# Patient Record
Sex: Female | Born: 1990
Health system: Southern US, Community
[De-identification: ages and names within clinical notes are randomized; demographics above are authoritative.]

## PROBLEM LIST (undated history)

## (undated) HISTORY — PX: CATARACT PEDIATRIC: SHX6289

---

## 2010-02-16 ENCOUNTER — Emergency Department (HOSPITAL_COMMUNITY)
Admission: EM | Admit: 2010-02-16 | Discharge: 2010-02-17 | Payer: Self-pay | Source: Home / Self Care | Admitting: Emergency Medicine

## 2010-02-22 LAB — CBC
HCT: 40.7 % (ref 36.0–46.0)
Hemoglobin: 14.3 g/dL (ref 12.0–15.0)
MCH: 32.9 pg (ref 26.0–34.0)
MCHC: 35.1 g/dL (ref 30.0–36.0)
MCV: 93.8 fL (ref 78.0–100.0)
Platelets: 177 10*3/uL (ref 150–400)
RBC: 4.34 MIL/uL (ref 3.87–5.11)
RDW: 12.2 % (ref 11.5–15.5)
WBC: 7.3 10*3/uL (ref 4.0–10.5)

## 2010-02-22 LAB — WET PREP, GENITAL
Trich, Wet Prep: NONE SEEN
Yeast Wet Prep HPF POC: NONE SEEN

## 2010-02-22 LAB — POCT I-STAT, CHEM 8
BUN: 10 mg/dL (ref 6–23)
Calcium, Ion: 1.13 mmol/L (ref 1.12–1.32)
Chloride: 102 mEq/L (ref 96–112)
Creatinine, Ser: 1 mg/dL (ref 0.4–1.2)
Glucose, Bld: 90 mg/dL (ref 70–99)
HCT: 44 % (ref 36.0–46.0)
Hemoglobin: 15 g/dL (ref 12.0–15.0)
Potassium: 3.1 mEq/L — ABNORMAL LOW (ref 3.5–5.1)
Sodium: 137 mEq/L (ref 135–145)
TCO2: 24 mmol/L (ref 0–100)

## 2010-02-22 LAB — DIFFERENTIAL
Basophils Absolute: 0 10*3/uL (ref 0.0–0.1)
Basophils Relative: 0 % (ref 0–1)
Eosinophils Absolute: 0.1 10*3/uL (ref 0.0–0.7)
Eosinophils Relative: 1 % (ref 0–5)
Lymphocytes Relative: 9 % — ABNORMAL LOW (ref 12–46)
Lymphs Abs: 0.6 10*3/uL — ABNORMAL LOW (ref 0.7–4.0)
Monocytes Absolute: 0.9 10*3/uL (ref 0.1–1.0)
Monocytes Relative: 12 % (ref 3–12)
Neutro Abs: 5.8 10*3/uL (ref 1.7–7.7)
Neutrophils Relative %: 79 % — ABNORMAL HIGH (ref 43–77)

## 2010-02-22 LAB — URINALYSIS, ROUTINE W REFLEX MICROSCOPIC
Bilirubin Urine: NEGATIVE
Hgb urine dipstick: NEGATIVE
Nitrite: NEGATIVE
Protein, ur: NEGATIVE mg/dL
Specific Gravity, Urine: 1.037 — ABNORMAL HIGH (ref 1.005–1.030)
Urine Glucose, Fasting: NEGATIVE mg/dL
Urobilinogen, UA: 1 mg/dL (ref 0.0–1.0)
pH: 7 (ref 5.0–8.0)

## 2010-02-22 LAB — PREGNANCY, URINE: Preg Test, Ur: NEGATIVE

## 2010-02-22 LAB — GC/CHLAMYDIA PROBE AMP, GENITAL
Chlamydia, DNA Probe: NEGATIVE
GC Probe Amp, Genital: NEGATIVE

## 2011-03-03 ENCOUNTER — Encounter (HOSPITAL_COMMUNITY): Payer: Self-pay

## 2011-03-03 ENCOUNTER — Emergency Department (INDEPENDENT_AMBULATORY_CARE_PROVIDER_SITE_OTHER): Payer: BC Managed Care – PPO

## 2011-03-03 ENCOUNTER — Emergency Department (HOSPITAL_COMMUNITY)
Admission: EM | Admit: 2011-03-03 | Discharge: 2011-03-03 | Disposition: A | Discharge status: Home / Self Care | Payer: BC Managed Care – PPO | Source: Home / Self Care | Attending: Family Medicine | Admitting: Family Medicine

## 2011-03-03 DIAGNOSIS — M940 Chondrocostal junction syndrome [Tietze]: Secondary | ICD-10-CM

## 2011-03-03 MED ORDER — NAPROXEN 375 MG PO TABS: 375.0000 mg | ORAL_TABLET | Freq: Two times a day (BID) | ORAL | Status: AC

## 2011-03-03 NOTE — ED Notes (Signed)
C/o pain in rt lower, ante. Ribs with movement, palpation and breathing since yesterday.  Denies recent cold/cough or injury.

## 2011-03-03 NOTE — ED Provider Notes (Signed)
History     CSN: [id number]  Arrival date & time 03/03/11  1003   First MD Initiated Contact with Patient 03/03/11 1035      Chief Complaint  Patient presents with  . Chest Pain    (Consider location/radiation/quality/duration/timing/severity/associated sxs/prior treatment) Patient is a 21 y.o. female presenting with chest pain. The history is provided by the patient.  Chest Pain The chest pain began yesterday. Chest pain occurs constantly. The chest pain is unchanged. The pain is associated with breathing, coughing, stress and exertion. The severity of the pain is moderate. The quality of the pain is described as sharp and pleuritic. The pain does not radiate. Chest pain is worsened by exertion and deep breathing. Pertinent negatives for primary symptoms include no fever, no fatigue, no cough and no wheezing. She tried nothing for the symptoms. Risk factors include no known risk factors.     History reviewed. No pertinent past medical history.  History reviewed. No pertinent past surgical history.  No family history on file.  History  Substance Use Topics  . Smoking status: Never Smoker   . Smokeless tobacco: Not on file  . Alcohol Use: No    OB History    Grav Para Term Preterm Abortions TAB SAB Ect Mult Living                  Review of Systems  Constitutional: Negative.  Negative for fever and fatigue.  HENT: Negative.   Eyes: Negative.   Respiratory: Negative.  Negative for cough and wheezing.   Cardiovascular: Positive for chest pain.  Gastrointestinal: Negative.   Genitourinary: Negative.   Musculoskeletal: Negative.   Skin: Negative.   Neurological: Negative.     Allergies  Review of patient's allergies indicates no known allergies.  Home Medications   Current Outpatient Rx  Name Route Sig Dispense Refill  . NAPROXEN 375 MG PO TABS Oral Take 1 tablet (375 mg total) by mouth 2 (two) times daily. 20 tablet 0    BP 122/73  Pulse 83  Temp(Src) 98.5  F (36.9 C) (Oral)  Resp 16  SpO2 99%  LMP 02/04/2011  Physical Exam  Nursing note and vitals reviewed. Constitutional: She is oriented to person, place, and time. She appears well-developed and well-nourished.  HENT:  Head: Normocephalic and atraumatic.  Eyes: EOM are normal.  Neck: Normal range of motion.  Pulmonary/Chest: Effort normal and breath sounds normal. She has no wheezes. She has no rhonchi. She exhibits tenderness.    Musculoskeletal: Normal range of motion.  Neurological: She is alert and oriented to person, place, and time.  Skin: Skin is warm and dry.  Psychiatric: Her behavior is normal.    ED Course  Procedures (including critical care time)  Labs Reviewed - No data to display No results found.   1. Costochondritis       MDM  Chest wall tenderness; rx given for naproxen        Richardo Priest, MD 03/27/11 [phone]

## 2012-10-24 ENCOUNTER — Emergency Department (HOSPITAL_COMMUNITY)
Admission: EM | Admit: 2012-10-24 | Discharge: 2012-10-24 | Disposition: A | Discharge status: Home / Self Care | Payer: BC Managed Care – PPO | Source: Home / Self Care | Attending: Family Medicine | Admitting: Family Medicine

## 2012-10-24 ENCOUNTER — Encounter (HOSPITAL_COMMUNITY): Payer: Self-pay

## 2012-10-24 DIAGNOSIS — R51 Headache: Secondary | ICD-10-CM

## 2012-10-24 MED ORDER — KETOROLAC TROMETHAMINE 30 MG/ML IJ SOLN
30.0000 mg | Freq: Once | INTRAMUSCULAR | Status: AC
  Administered 2012-10-24: 30 mg via INTRAMUSCULAR

## 2012-10-24 MED ORDER — KETOROLAC TROMETHAMINE 30 MG/ML IJ SOLN
INTRAMUSCULAR | Status: AC
  Filled 2012-10-24: qty 1

## 2012-10-24 MED ORDER — DEXAMETHASONE SODIUM PHOSPHATE 10 MG/ML IJ SOLN
10.0000 mg | Freq: Once | INTRAMUSCULAR | Status: AC
  Administered 2012-10-24: 10 mg via INTRAMUSCULAR

## 2012-10-24 MED ORDER — DEXAMETHASONE SODIUM PHOSPHATE 10 MG/ML IJ SOLN
INTRAMUSCULAR | Status: AC
  Filled 2012-10-24: qty 1

## 2012-10-24 NOTE — ED Provider Notes (Signed)
Medical screening examination/treatment/procedure(s) were performed by non-physician practitioner and as supervising physician I was immediately available for consultation/collaboration.  Kyrus Hyde, M.D.  Prentiss Polio C Allecia Bells, MD 10/24/12 1722 

## 2012-10-24 NOTE — ED Notes (Signed)
C/o headache since yesterday w n/v/d; no one else in home ill; states no relief w OTC medication; hist of cataract surgery

## 2012-10-24 NOTE — ED Provider Notes (Signed)
CSN: [id number]     Arrival date & time 10/24/12  1258 History   First MD Initiated Contact with Patient 10/24/12 1330     Chief Complaint  Patient presents with  . Headache   (Consider location/radiation/quality/duration/timing/severity/associated sxs/prior Treatment) HPI Comments: 22 year old female presents with a headache for 24 hours. Yesterday the headache began and was at its worse associated with dizziness, vomiting and diarrhea. Overnight the GI symptoms have abated and her headache is improved somewhat but she continues to have headache over both of her is across reported. She has a decreased appetite and is taking Excedrin headache for her symptoms. Denies problems with vision, speech, hearing, swallowing or other focal neurologic complaint. She has no PCP. She has a history of headaches in her childhood but caused by cataracts per patient. She has since had surgery for this.  Patient is a 22 y.o. female presenting with headaches.  Headache Associated symptoms: no congestion, no dizziness, no ear pain, no pain, no fatigue, no fever, no neck pain, no seizures, no sinus pressure and no sore throat     History reviewed. No pertinent past medical history. History reviewed. No pertinent past surgical history. History reviewed. No pertinent family history. History  Substance Use Topics  . Smoking status: Never Smoker   . Smokeless tobacco: Not on file  . Alcohol Use: No   OB History   Grav Para Term Preterm Abortions TAB SAB Ect Mult Living                 Review of Systems  Constitutional: Positive for appetite change. Negative for fever, activity change and fatigue.  HENT: Negative for ear pain, congestion, sore throat, facial swelling, rhinorrhea, neck pain and sinus pressure.   Eyes: Negative for pain, discharge, redness and visual disturbance.  Respiratory: Negative.   Cardiovascular: Negative.   Gastrointestinal:       No symptoms today.  Genitourinary: Negative.    Musculoskeletal: Negative.   Skin: Negative.   Neurological: Positive for headaches. Negative for dizziness, tremors, seizures, syncope, speech difficulty and weakness.  Psychiatric/Behavioral: Negative.     Allergies  Review of patient's allergies indicates no known allergies.  Home Medications  No current outpatient prescriptions on file. BP 120/77  Pulse 80  Temp(Src) 98.9 F (37.2 C) (Oral)  Resp 16  SpO2 100% Physical Exam  Nursing note and vitals reviewed. Constitutional: She is oriented to person, place, and time. She appears well-developed and well-nourished. No distress.  HENT:  Head: Normocephalic and atraumatic.  Mouth/Throat: Oropharynx is clear and moist. No oropharyngeal exudate.  Eyes: Conjunctivae and EOM are normal. Pupils are equal, round, and reactive to light.  Neck: Normal range of motion. Neck supple.  Cardiovascular: Normal rate, regular rhythm and normal heart sounds.   Pulmonary/Chest: Effort normal and breath sounds normal. No respiratory distress.  Abdominal: Soft. She exhibits no distension. There is no tenderness.  Musculoskeletal: She exhibits no edema.  Lymphadenopathy:    She has no cervical adenopathy.  Neurological: She is alert and oriented to person, place, and time. She has normal strength. She displays no tremor. No cranial nerve deficit or sensory deficit. She exhibits normal muscle tone. Coordination and gait normal.  Skin: Skin is warm and dry. No rash noted.  Psychiatric: She has a normal mood and affect.    ED Course  Procedures (including critical care time) Labs Review Labs Reviewed - No data to display Imaging Review No results found.  MDM   1. Headache above  the eye region       Differential includes H/A associated with cataracts, viral syndrome or tension. No neurologic sx's or abnormal findings.  Toradol 30mg  IM  Decadron 10mg  IM. Tylenol q 4h prn.  Hayden Rasmussen, NP 10/24/12 1426

## 2012-10-25 ENCOUNTER — Encounter (HOSPITAL_COMMUNITY): Payer: Self-pay

## 2012-10-25 ENCOUNTER — Emergency Department (HOSPITAL_COMMUNITY)
Admission: EM | Admit: 2012-10-25 | Discharge: 2012-10-25 | Disposition: A | Discharge status: Home / Self Care | Payer: BC Managed Care – PPO | Attending: Emergency Medicine | Admitting: Emergency Medicine

## 2012-10-25 DIAGNOSIS — Z3202 Encounter for pregnancy test, result negative: Secondary | ICD-10-CM | POA: Insufficient documentation

## 2012-10-25 DIAGNOSIS — Z79899 Other long term (current) drug therapy: Secondary | ICD-10-CM | POA: Insufficient documentation

## 2012-10-25 DIAGNOSIS — G43909 Migraine, unspecified, not intractable, without status migrainosus: Secondary | ICD-10-CM | POA: Insufficient documentation

## 2012-10-25 DIAGNOSIS — R110 Nausea: Secondary | ICD-10-CM

## 2012-10-25 DIAGNOSIS — R197 Diarrhea, unspecified: Secondary | ICD-10-CM | POA: Insufficient documentation

## 2012-10-25 LAB — POCT PREGNANCY, URINE: Preg Test, Ur: NEGATIVE

## 2012-10-25 MED ORDER — KETOROLAC TROMETHAMINE 15 MG/ML IJ SOLN
15.0000 mg | Freq: Once | INTRAMUSCULAR | Status: AC
  Administered 2012-10-25: 15 mg via INTRAVENOUS
  Filled 2012-10-25: qty 1

## 2012-10-25 MED ORDER — SODIUM CHLORIDE 0.9 % IV BOLUS (SEPSIS)
500.0000 mL | Freq: Once | INTRAVENOUS | Status: AC
  Administered 2012-10-25: 500 mL via INTRAVENOUS

## 2012-10-25 MED ORDER — METOCLOPRAMIDE HCL 10 MG PO TABS: 5.0000 mg | ORAL_TABLET | Freq: Four times a day (QID) | ORAL | Status: AC | PRN

## 2012-10-25 MED ORDER — DIPHENHYDRAMINE HCL 50 MG/ML IJ SOLN
25.0000 mg | Freq: Once | INTRAMUSCULAR | Status: AC
  Administered 2012-10-25: 25 mg via INTRAVENOUS
  Filled 2012-10-25: qty 1

## 2012-10-25 MED ORDER — METOCLOPRAMIDE HCL 5 MG/ML IJ SOLN
10.0000 mg | Freq: Once | INTRAMUSCULAR | Status: AC
  Administered 2012-10-25: 10 mg via INTRAVENOUS
  Filled 2012-10-25: qty 2

## 2012-10-25 NOTE — ED Provider Notes (Signed)
CSN: [id number]     Arrival date & time 10/25/12  0802 History   First MD Initiated Contact with Patient 10/25/12 0809     Chief Complaint  Patient presents with  . Migraine   (Consider location/radiation/quality/duration/timing/severity/associated sxs/prior Treatment) HPI Comments: 22 yo female with cataract hx right eye presents with frontal HA for three days, started off mild along with a GI illness then gradually worsened, photophobia, mild nausea.  Hx of ha but not recently.  Similar to previous in the past.  No blood clot hx.  Pt is on birth control but it has been years.  No injuries or fevers.  No travel or tic bites. Pt had an MRI brain years ago.  Patient is a 22 y.o. female presenting with migraines. The history is provided by the patient.  Migraine This is a recurrent problem. Associated symptoms include headaches. Pertinent negatives include no chest pain, no abdominal pain and no shortness of breath.    History reviewed. No pertinent past medical history. Past Surgical History  Procedure Laterality Date  . Cataract pediatric     History reviewed. No pertinent family history. History  Substance Use Topics  . Smoking status: Never Smoker   . Smokeless tobacco: Not on file  . Alcohol Use: No   OB History   Grav Para Term Preterm Abortions TAB SAB Ect Mult Living                 Review of Systems  Constitutional: Negative for fever and chills.  HENT: Negative for neck pain and neck stiffness.   Eyes: Negative for visual disturbance.  Respiratory: Negative for shortness of breath.   Cardiovascular: Negative for chest pain.  Gastrointestinal: Positive for nausea and diarrhea. Negative for vomiting and abdominal pain.  Genitourinary: Negative for dysuria and flank pain.  Musculoskeletal: Negative for back pain.  Skin: Negative for rash.  Neurological: Positive for headaches. Negative for light-headedness.    Allergies  Review of patient's allergies indicates no  known allergies.  Home Medications   Current Outpatient Rx  Name  Route  Sig  Dispense  Refill  . aspirin-acetaminophen-caffeine (EXCEDRIN MIGRAINE) 250-250-65 MG per tablet   Oral   Take 2 tablets by mouth every 6 (six) hours as needed for pain.         Marland Kitchen GENERESS FE 0.8-25 MG-MCG tablet   Oral   Chew 1 tablet by mouth daily.          LMP 10/04/2012 Physical Exam  Nursing note and vitals reviewed. Constitutional: She is oriented to person, place, and time. She appears well-developed and well-nourished.  HENT:  Head: Normocephalic and atraumatic.  Eyes: Conjunctivae are normal. Right eye exhibits no discharge. Left eye exhibits no discharge.  Neck: Normal range of motion. Neck supple. No tracheal deviation present.  Cardiovascular: Normal rate and regular rhythm.   Pulmonary/Chest: Effort normal and breath sounds normal.  Abdominal: Soft. She exhibits no distension. There is no tenderness. There is no guarding.  Musculoskeletal: She exhibits no edema.  Neurological: She is alert and oriented to person, place, and time. No cranial nerve deficit or sensory deficit. GCS eye subscore is 4. GCS verbal subscore is 5. GCS motor subscore is 6.  5+ strength in UE and LE with f/e at major joints. Sensation to palpation intact in UE and LE. CNs 2-12 grossly intact.  EOMFI.  PERRL.   Finger nose and coordination intact bilateral.   Visual fields intact to finger testing.   Skin:  Skin is warm. No rash noted.  Psychiatric: She has a normal mood and affect.    ED Course  Procedures (including critical care time) Labs Review Labs Reviewed - No data to display Imaging Review No results found.  MDM  No diagnosis found. Low risk HA. Migraine cocktail and fluids in ED.  Pt improved significantly on recheck. Normal neuro exam. HPI and exam consistent with SAH, pt very low risk  And pretest prob too low at this time for venous thrombosis and with HA starting after GI infection likely  component of dehydration vs migraine.   Close fup outpt discussed. No indication for imaging at this time, discussed she may need MRI if HA returns. Reasons to return given.     Enid Skeens, MD 10/25/12 1005

## 2012-10-25 NOTE — ED Notes (Signed)
Pt c/o migraine x 3 days.  Pain score 8/10. Sts recently had a virus.  Sts other symptoms resolved, but headache remained.

## 2012-10-25 NOTE — ED Notes (Signed)
Pt escorted to discharge window. Verbalized understanding discharge instructions. In no acute distress. Vitals reviewed and WDL.  

## 2012-10-25 NOTE — Progress Notes (Signed)
[facility] ED CM noted pt with coverage but no pcp listed Spoke with pt who confirms no pcp  [facility] ED CM spoke with pt on how to obtain an in network pcp with insurance coverage via the customer service number or web site  Cm reviewed ED level of care for crisis/emergent services and community pcp level of care to manage continuous or chronic medical concerns.  The pt voiced understanding CM encouraged pt and discussed pt's responsibility to verify with pt's insurance carrier that any recommended medical provider offered by any emergency room or a hospital provider is within the carrier's network. The pt voiced understanding      

## 2012-10-30 IMAGING — CR DG RIBS W/ CHEST 3+V*R*
3 series · 3 of 3 positions shown · non-contrast
Comparison: None.

CLINICAL DATA: Right chest pain.  Right upper quadrant pain.

RIGHT RIBS AND CHEST - 3+ VIEW

[view not recorded (1 of 3)]
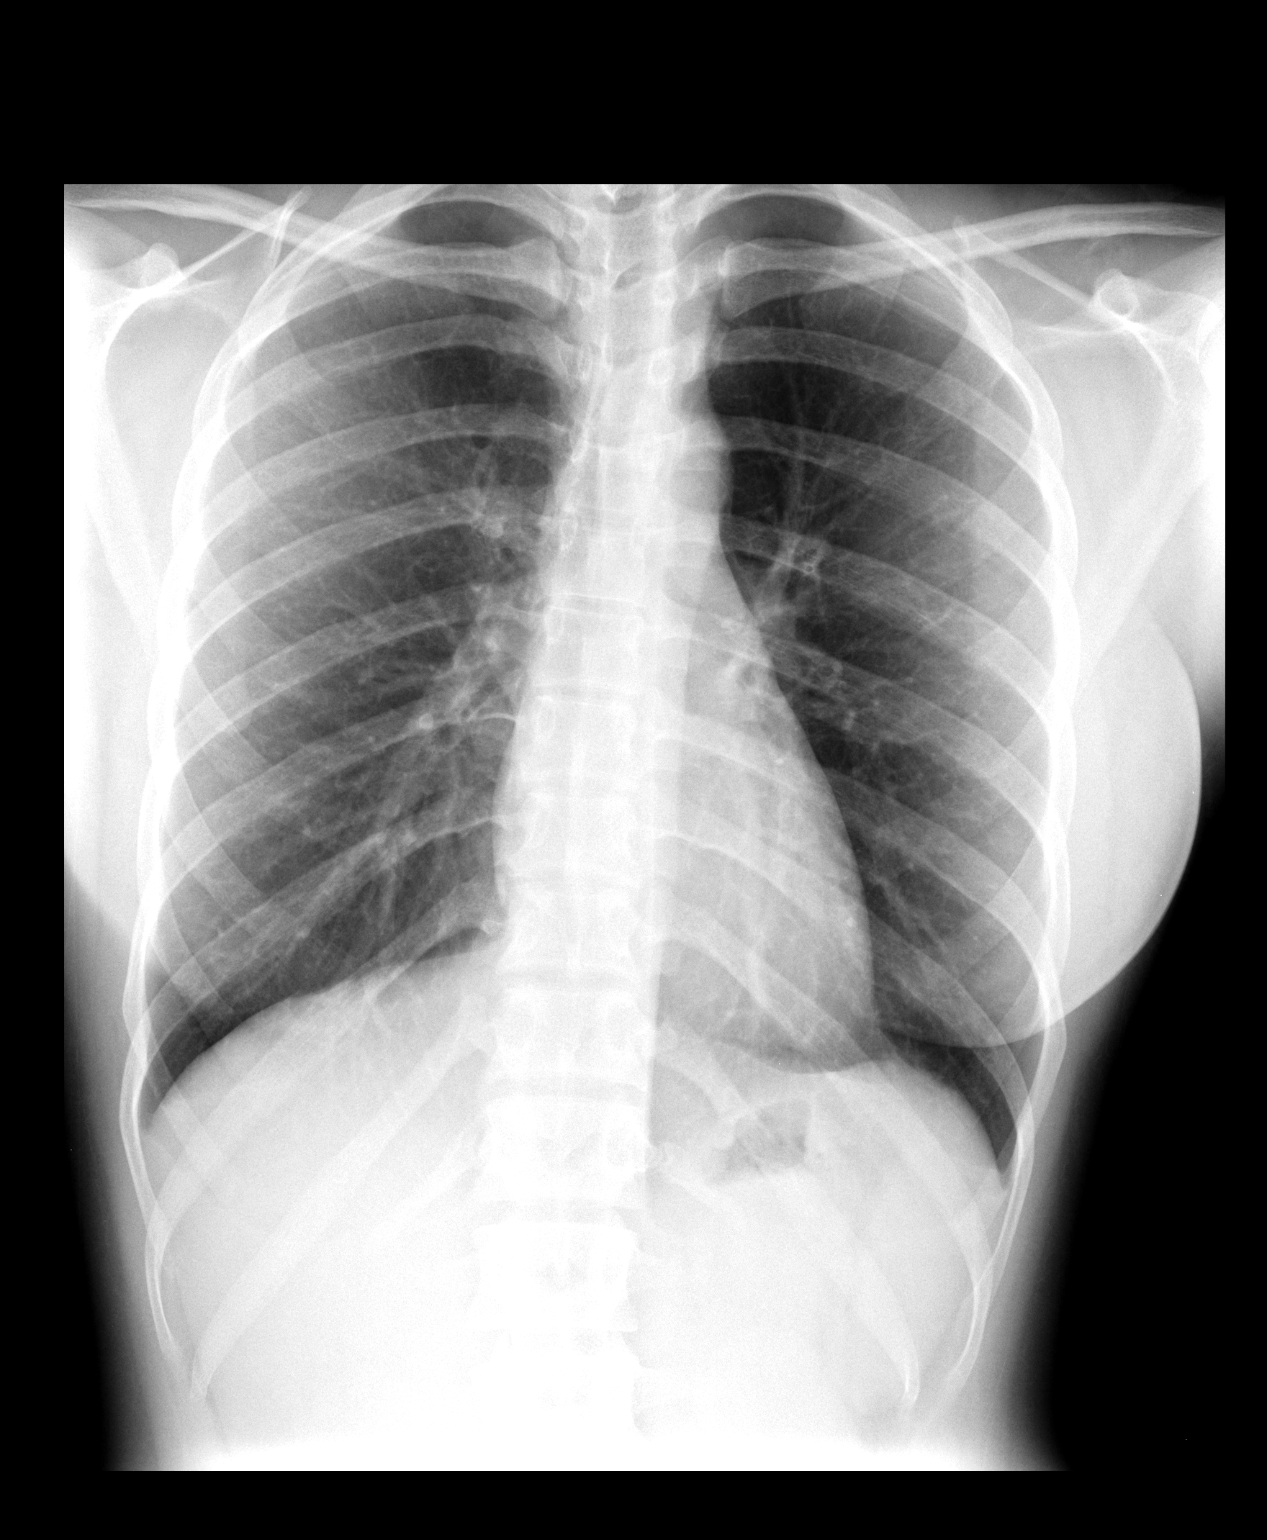

[view not recorded (2 of 3)]
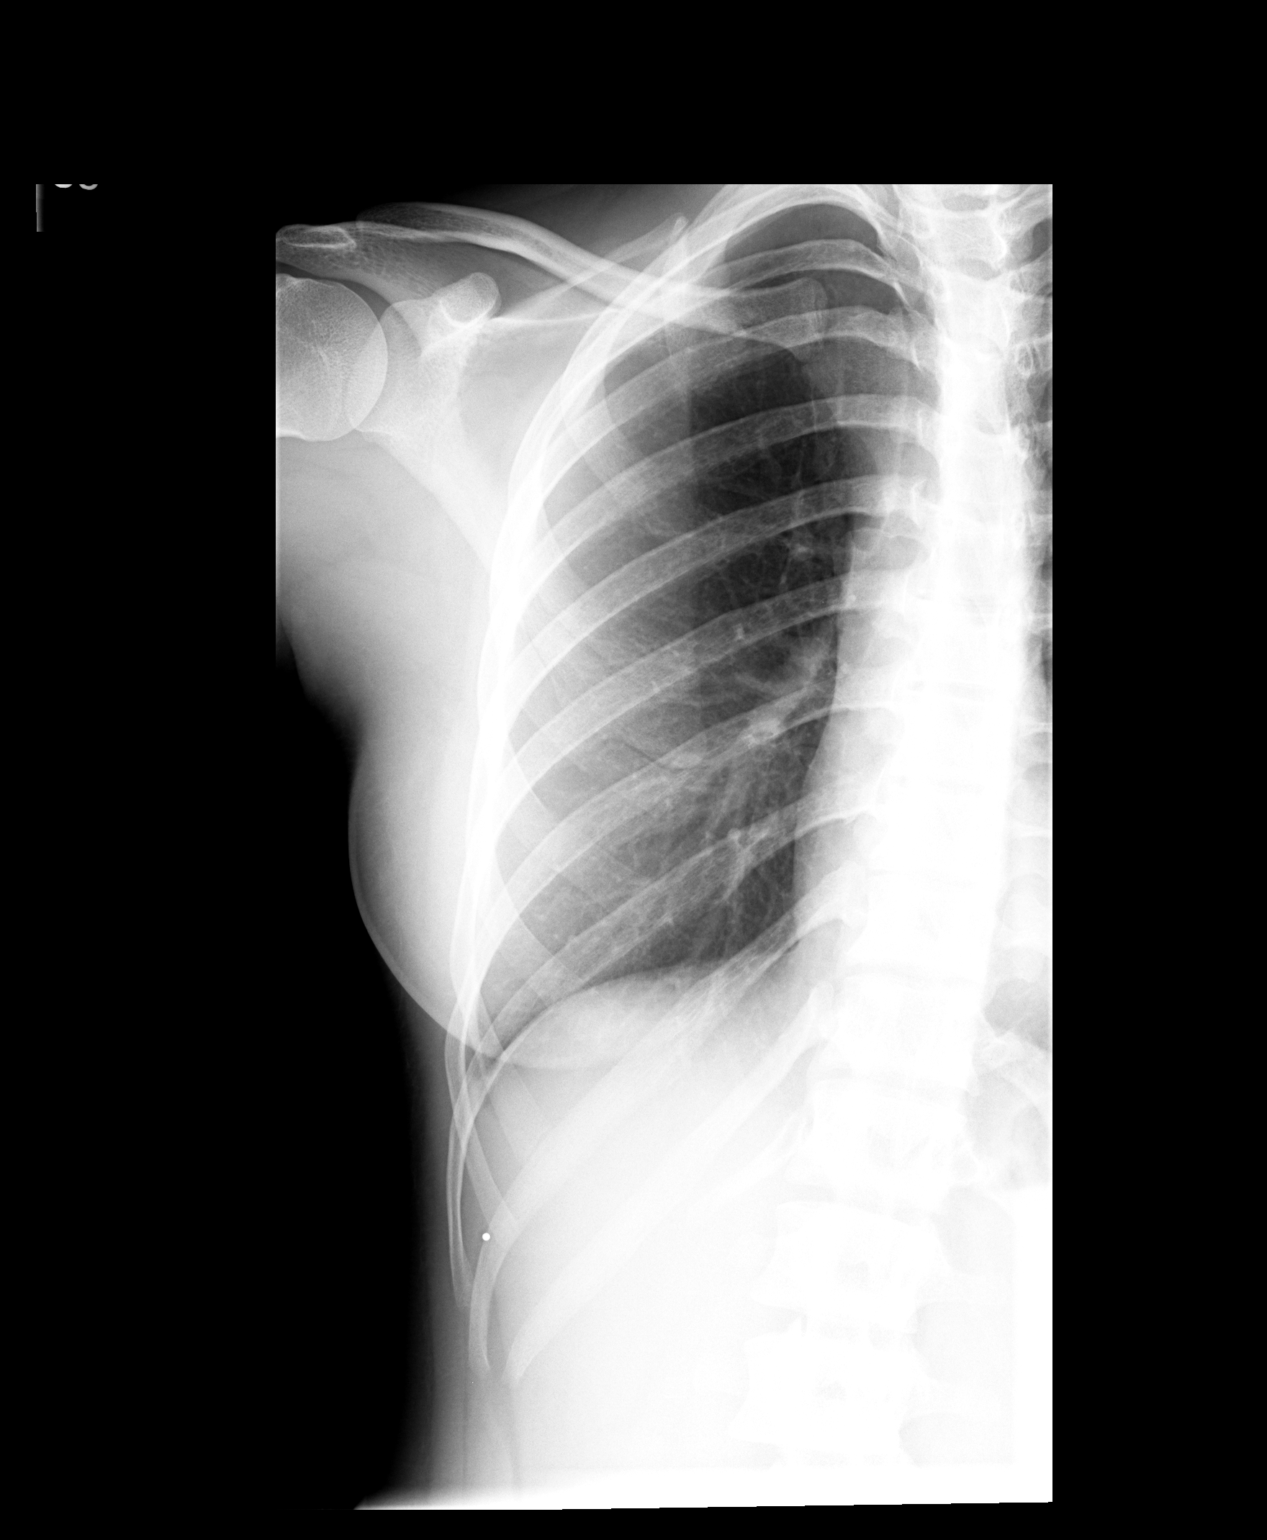

[view not recorded (3 of 3)]
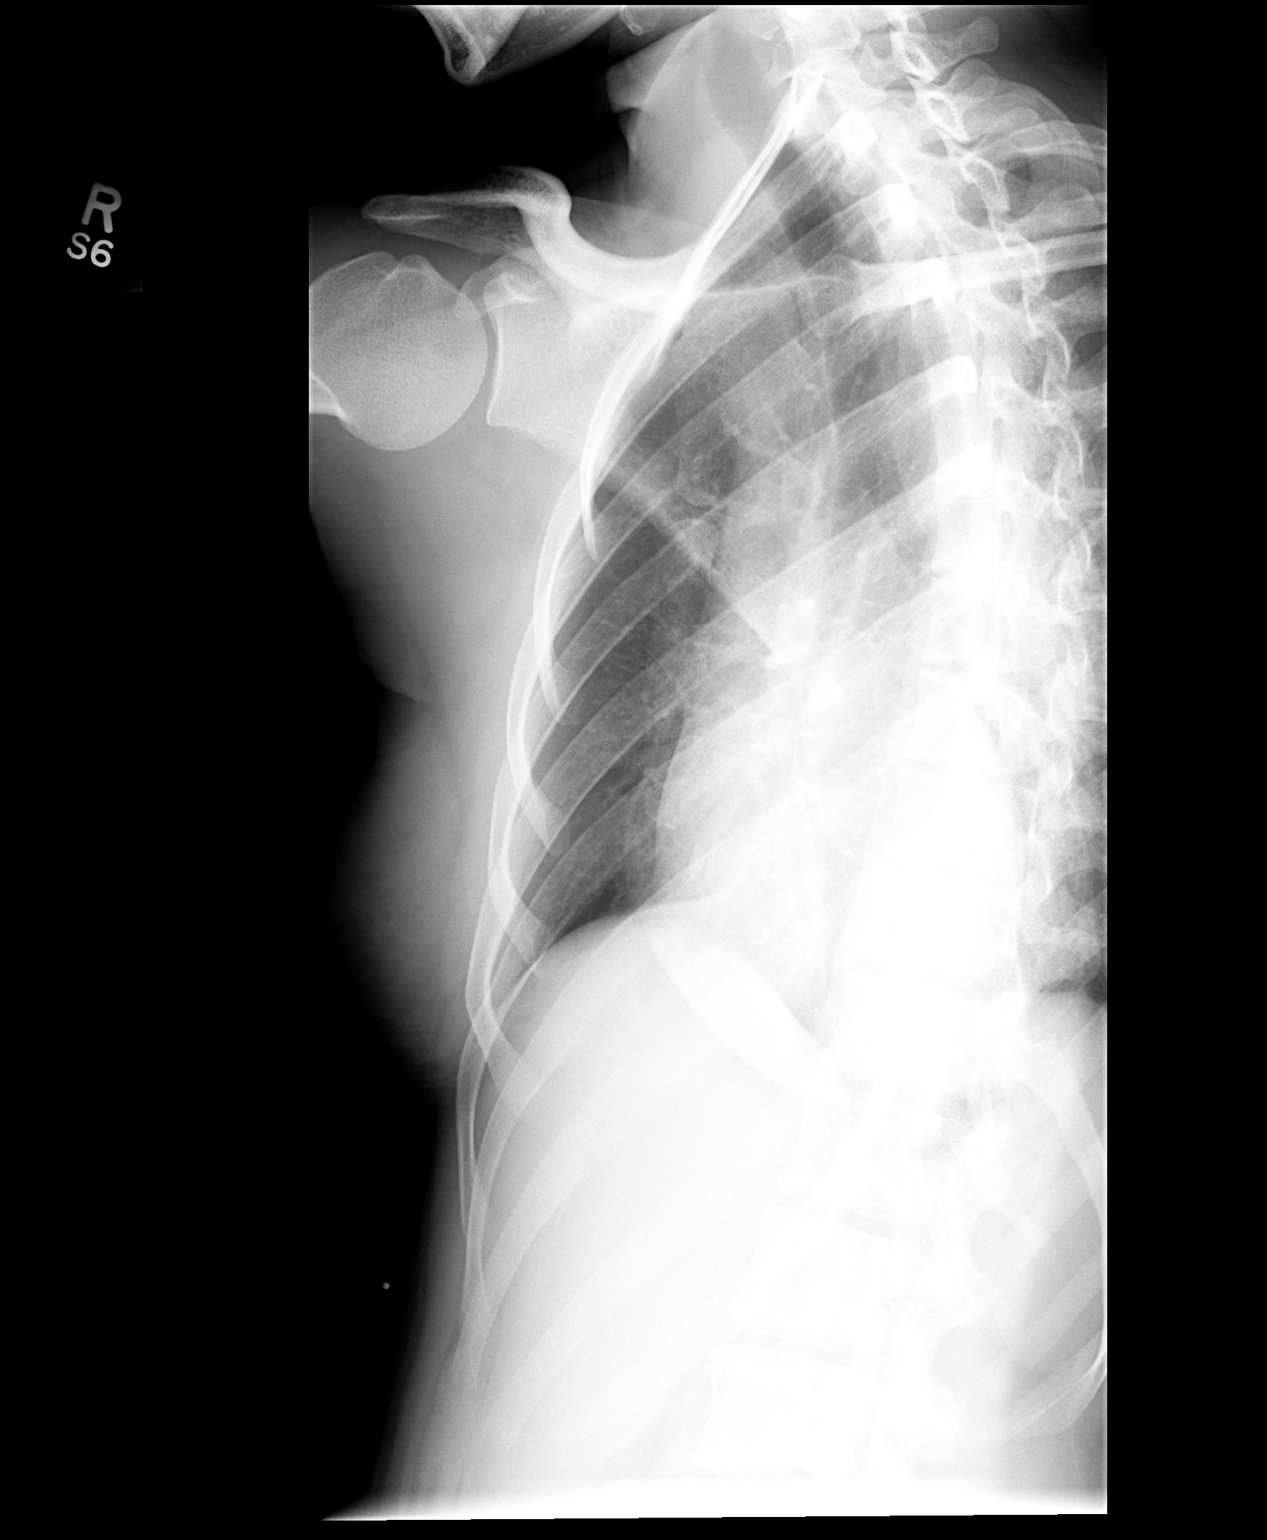

[3 of 3 positions shown; findings below may reference images not displayed]

FINDINGS: Trachea is midline.  Heart size normal.  Biapical pleural
thickening.  Lungs are otherwise clear.  No pleural fluid.  No
evidence of an acute rib fracture.
IMPRESSION: No acute findings.

## 2013-03-31 ENCOUNTER — Emergency Department (HOSPITAL_COMMUNITY)
Admission: EM | Admit: 2013-03-31 | Discharge: 2013-03-31 | Disposition: A | Discharge status: Home / Self Care | Payer: BC Managed Care – PPO | Source: Home / Self Care | Attending: Family Medicine | Admitting: Family Medicine

## 2013-03-31 ENCOUNTER — Encounter (HOSPITAL_COMMUNITY): Payer: Self-pay | Admitting: Emergency Medicine

## 2013-03-31 DIAGNOSIS — L259 Unspecified contact dermatitis, unspecified cause: Secondary | ICD-10-CM

## 2013-03-31 DIAGNOSIS — L309 Dermatitis, unspecified: Secondary | ICD-10-CM

## 2013-03-31 MED ORDER — TERBINAFINE HCL 1 % EX CREA: 1.0000 "application " | TOPICAL_CREAM | Freq: Two times a day (BID) | CUTANEOUS | Status: AC

## 2013-03-31 MED ORDER — TRIAMCINOLONE ACETONIDE 0.1 % EX CREA: 1.0000 "application " | TOPICAL_CREAM | Freq: Two times a day (BID) | CUTANEOUS | Status: AC

## 2013-03-31 NOTE — Discharge Instructions (Signed)
Thank you for coming in today. Apply over the counter lamisil cream twice daily for 2 weeks.  Use tyriamcinolone cream twice daily until the skin looks normal.  Come back if not getting better.  Athlete's Foot Athlete's foot (tinea pedis) is a fungal infection of the skin on the feet. It often occurs on the skin between the toes or underneath the toes. It can also occur on the soles of the feet. Athlete's foot is more likely to occur in hot, humid weather. Not washing your feet or changing your socks often enough can contribute to athlete's foot. The infection can spread from person to person (contagious). CAUSES Athlete's foot is caused by a fungus. This fungus thrives in warm, moist places. Most people get athlete's foot by sharing shower stalls, towels, and wet floors with an infected person. People with weakened immune systems, including those with diabetes, may be more likely to get athlete's foot. SYMPTOMS   Itchy areas between the toes or on the soles of the feet.  White, flaky, or scaly areas between the toes or on the soles of the feet.  Tiny, intensely itchy blisters between the toes or on the soles of the feet.  Tiny cuts on the skin. These cuts can develop a bacterial infection.  Thick or discolored toenails. DIAGNOSIS  Your caregiver can usually tell what the problem is by doing a physical exam. Your caregiver may also take a skin sample from the rash area. The skin sample may be examined under a microscope, or it may be tested to see if fungus will grow in the sample. A sample may also be taken from your toenail for testing. TREATMENT  Over-the-counter and prescription medicines can be used to kill the fungus. These medicines are available as powders or creams. Your caregiver can suggest medicines for you. Fungal infections respond slowly to treatment. You may need to continue using your medicine for several weeks. PREVENTION   Do not share towels.  Wear sandals in wet areas,  such as shared locker rooms and shared showers.  Keep your feet dry. Wear shoes that allow air to circulate. Wear cotton or wool socks. HOME CARE INSTRUCTIONS   Take medicines as directed by your caregiver. Do not use steroid creams on athlete's foot.  Keep your feet clean and cool. Wash your feet daily and dry them thoroughly, especially between your toes.  Change your socks every day. Wear cotton or wool socks. In hot climates, you may need to change your socks 2 to 3 times per day.  Wear sandals or canvas tennis shoes with good air circulation.  If you have blisters, soak your feet in Burow's solution or Epsom salts for 20 to 30 minutes, 2 times a day to dry out the blisters. Make sure you dry your feet thoroughly afterward. SEEK MEDICAL CARE IF:   You have a fever.  You have swelling, soreness, warmth, or redness in your foot.  You are not getting better after 7 days of treatment.  You are not completely cured after 30 days.  You have any problems caused by your medicines. MAKE SURE YOU:   Understand these instructions.  Will watch your condition.  Will get help right away if you are not doing well or get worse. Document Released: 01/22/2000 Document Revised: 04/18/2011 Document Reviewed: 11/12/2010 [facility] Patient Information 2014 [employer].

## 2013-03-31 NOTE — ED Notes (Signed)
C/o bilateral foot swelling and itching.  Pain with bending toes.  On set last night.  Pt has tried soaking feet and using lotions with no relief.  Denies any changes in soaps, detergents, and lotions.  No new meds or foot wear.

## 2013-03-31 NOTE — ED Provider Notes (Signed)
Jasmin Cervantes is a 23 y.o. female who presents to Urgent Care today for bilateral dorsal toe swelling and itching. This started last night. Patient also noted some mild swelling and soreness when she flexes her toes. She denies any radiating pain weakness or numbness. She's tried Epsom salt. Lotion and go but encounter which has not helped. She denies any injury or exposures. No new detergents or medications. No flulike illness. She feels well otherwise.   History reviewed. No pertinent past medical history. History  Substance Use Topics  . Smoking status: Never Smoker   . Smokeless tobacco: Not on file  . Alcohol Use: No   ROS as above Medications: No current facility-administered medications for this encounter.   Current Outpatient Prescriptions  Medication Sig Dispense Refill  . aspirin-acetaminophen-caffeine (EXCEDRIN MIGRAINE) 250-250-65 MG per tablet Take 2 tablets by mouth every 6 (six) hours as needed for pain.      Marland Kitchen. GENERESS FE 0.8-25 MG-MCG tablet Chew 1 tablet by mouth daily.      . metoCLOPramide (REGLAN) 10 MG tablet Take 0.5 tablets (5 mg total) by mouth every 6 (six) hours as needed (nausea/headache).  6 tablet  0  . terbinafine (LAMISIL AT) 1 % cream Apply 1 application topically 2 (two) times daily.  30 g  0  . triamcinolone cream (KENALOG) 0.1 % Apply 1 application topically 2 (two) times daily. Until skin looks normal  30 g  1    Exam:  BP 122/79  Pulse 87  Temp(Src) 98.4 F (36.9 C) (Oral)  Resp 20  SpO2 100% Gen: Well NAD SKIN: Toes 2-5 bilaterally grossly are very mildly swollen. Nontender otherwise normal appearing. Normal motion. Capillary refill and sensation are intact distally no splinter hemorrhages.     Assessment and Plan: 23 y.o. female with dermatitis. Possible tinea pedis.   symptoms are so early the rash is not differentiated yet. We'll treat with topical terbinafine and triamcinolone.  Followup if not improving  Discussed  warning signs or symptoms. Please see discharge instructions. Patient expresses understanding.    Rodolph BongEvan S Teryl Mcconaghy, MD 03/31/13 [phone]

## 2017-02-23 DIAGNOSIS — O1213 Gestational proteinuria, third trimester: Secondary | ICD-10-CM | POA: Diagnosis not present

## 2017-02-23 DIAGNOSIS — Z113 Encounter for screening for infections with a predominantly sexual mode of transmission: Secondary | ICD-10-CM | POA: Diagnosis not present

## 2017-02-23 DIAGNOSIS — Z3685 Encounter for antenatal screening for Streptococcus B: Secondary | ICD-10-CM | POA: Diagnosis not present

## 2017-03-18 DIAGNOSIS — Z79899 Other long term (current) drug therapy: Secondary | ICD-10-CM | POA: Diagnosis not present

## 2017-03-18 DIAGNOSIS — O9962 Diseases of the digestive system complicating childbirth: Secondary | ICD-10-CM | POA: Diagnosis not present

## 2017-03-18 DIAGNOSIS — E669 Obesity, unspecified: Secondary | ICD-10-CM | POA: Diagnosis not present

## 2017-03-18 DIAGNOSIS — O4290 Premature rupture of membranes, unspecified as to length of time between rupture and onset of labor, unspecified weeks of gestation: Secondary | ICD-10-CM | POA: Diagnosis not present

## 2017-03-18 DIAGNOSIS — O99214 Obesity complicating childbirth: Secondary | ICD-10-CM | POA: Diagnosis not present

## 2017-03-18 DIAGNOSIS — Z3A39 39 weeks gestation of pregnancy: Secondary | ICD-10-CM | POA: Diagnosis not present

## 2017-05-01 DIAGNOSIS — Z3042 Encounter for surveillance of injectable contraceptive: Secondary | ICD-10-CM | POA: Diagnosis not present

## 2017-05-01 DIAGNOSIS — Z7251 High risk heterosexual behavior: Secondary | ICD-10-CM | POA: Diagnosis not present

## 2017-05-01 DIAGNOSIS — Z0000 Encounter for general adult medical examination without abnormal findings: Secondary | ICD-10-CM | POA: Diagnosis not present

## 2017-07-21 DIAGNOSIS — Z3042 Encounter for surveillance of injectable contraceptive: Secondary | ICD-10-CM | POA: Diagnosis not present

## 2017-08-03 ENCOUNTER — Ambulatory Visit (INDEPENDENT_AMBULATORY_CARE_PROVIDER_SITE_OTHER): Payer: Self-pay | Admitting: Family Medicine

## 2017-08-03 VITALS — BP 110/75 | HR 77 | Temp 99.3°F | Resp 16 | Ht 66.0 in | Wt 173.0 lb

## 2017-08-03 DIAGNOSIS — Z0000 Encounter for general adult medical examination without abnormal findings: Secondary | ICD-10-CM

## 2017-08-03 NOTE — Progress Notes (Signed)
Jasmin Cervantes is a 27 y.o. female who presents today with concerns of need for physical exam, she is 4 months post parturm [facility] She denies any chronic or acute health conditions that she wants addressed at this visit.  Review of Systems  Constitutional: Negative for chills, fever and malaise/fatigue.  HENT: Negative for congestion, ear discharge, ear pain, sinus pain and sore throat.   Eyes: Negative.   Respiratory: Negative for cough, sputum production and shortness of breath.   Cardiovascular: Negative.  Negative for chest pain.  Gastrointestinal: Negative for abdominal pain, diarrhea, nausea and vomiting.  Genitourinary: Negative for dysuria, frequency, hematuria and urgency.  Musculoskeletal: Negative for myalgias.  Skin: Negative.   Neurological: Negative for headaches.  Endo/Heme/Allergies: Negative.   Psychiatric/Behavioral: Negative.     O: Vitals:   08/03/17 1659  BP: 110/75  Pulse: 77  Resp: 16  Temp: 99.3 F (37.4 C)  SpO2: 99%     Physical Exam  Constitutional: She is oriented to person, place, and time. Vital signs are normal. She appears well-developed and well-nourished. She is active.  Non-toxic appearance. She does not have a sickly appearance.  HENT:  Head: Normocephalic.  Right Ear: Hearing, tympanic membrane, external ear and ear canal normal.  Left Ear: Hearing, tympanic membrane, external ear and ear canal normal.  Nose: Nose normal.  Mouth/Throat: Uvula is midline and oropharynx is clear and moist.  Neck: Normal range of motion. Neck supple.  Cardiovascular: Normal rate, regular rhythm, normal heart sounds and normal pulses.  Pulmonary/Chest: Effort normal and breath sounds normal.  Abdominal: Soft. Bowel sounds are normal.  Musculoskeletal: Normal range of motion.  Lymphadenopathy:       Head (right side): No submental and no submandibular adenopathy present.       Head (left side): No submental  and no submandibular adenopathy present.    She has no cervical adenopathy.  Neurological: She is alert and oriented to person, place, and time.  Psychiatric: She has a normal mood and affect. Her speech is normal and behavior is normal. Cognition and memory are normal.  PHQ-9- negative  Vitals reviewed.  A: 1. Physical exam    P: Exam findings, diagnosis etiology and medication use and indications reviewed with patient. Follow- Up and discharge instructions provided. No emergent/urgent issues found on exam.  Patient verbalized understanding of information provided and agrees with plan of care (POC), all questions answered.  1. Physical exam WNL Other orders - Prenatal Vit-Fe Fumarate-FA (PRENATAL MULTIVITAMIN) TABS tablet; Take 1 tablet by mouth daily at 12 noon.

## 2017-08-03 NOTE — Patient Instructions (Signed)

## 2017-10-10 DIAGNOSIS — Z3042 Encounter for surveillance of injectable contraceptive: Secondary | ICD-10-CM | POA: Diagnosis not present

## 2017-10-25 ENCOUNTER — Ambulatory Visit (INDEPENDENT_AMBULATORY_CARE_PROVIDER_SITE_OTHER): Payer: Self-pay | Admitting: Physician Assistant

## 2017-10-25 ENCOUNTER — Encounter: Payer: Self-pay | Admitting: Physician Assistant

## 2017-10-25 VITALS — BP 120/80 | HR 83 | Temp 98.3°F | Wt 176.0 lb

## 2017-10-25 DIAGNOSIS — L02214 Cutaneous abscess of groin: Secondary | ICD-10-CM

## 2017-10-25 MED ORDER — DOXYCYCLINE HYCLATE 100 MG PO TABS: 100.0000 mg | ORAL_TABLET | Freq: Two times a day (BID) | ORAL | 0 refills | Status: AC

## 2017-10-25 NOTE — Progress Notes (Signed)
Patient ID: Jasmin Cervantes DOB: 1990-04-25 AGE: 27 y.o. MRN: [id number]    Subjective:  HPI  Jasmin Cervantes is a 27 y.o. female presents for evaluation of "boil" in left groin. Began two weeks ago. Was initially larger, has decreased in size but has increased in tenderness/soreness. Denies bleeding or purulent drainage. Has become rubbing against pants, causing pain with ambulating. Has been applying OTC boil-eze with no symptom relief. Denies fever, chills, headache, bodyaches, nausea/vomiting. Patient with previous history of inguinal abscess requiring I&D and antibiotic. Patient reports mother also develops frequent axillary and inguinal abscesses; previous diagnosis of hidradenitis suppurativa.   10 point review of systems was completed and is negative unless otherwise stated.  The following portions of the patient's history were reviewed and updated as appropriate: allergies, current medications and past medical history.  There are no active problems to display for this patient.   No Known Allergies  Current Outpatient Medications on File Prior to Visit  Medication Sig Dispense Refill  . aspirin-acetaminophen-caffeine (EXCEDRIN MIGRAINE) 250-250-65 MG per tablet Take 2 tablets by mouth every 6 (six) hours as needed for pain.    . Prenatal Vit-Fe Fumarate-FA (PRENATAL MULTIVITAMIN) TABS tablet Take 1 tablet by mouth daily at 12 noon.    Marland Kitchen GENERESS FE 0.8-25 MG-MCG tablet Chew 1 tablet by mouth daily.    . metoCLOPramide (REGLAN) 10 MG tablet Take 0.5 tablets (5 mg total) by mouth every 6 (six) hours as needed (nausea/headache). (Patient not taking: Reported on 08/03/2017) 6 tablet 0  . terbinafine (LAMISIL AT) 1 % cream Apply 1 application topically 2 (two) times daily. (Patient not taking: Reported on 08/03/2017) 30 g 0  . triamcinolone cream (KENALOG) 0.1 % Apply 1 application topically 2 (two) times daily. Until skin looks normal (Patient not taking: Reported on  08/03/2017) 30 g 1   No current facility-administered medications on file prior to visit.     Objective:  Vitals:   10/25/17 1626  BP: 120/80  Pulse: 83  Temp: 98.3 F (36.8 C)  SpO2: 99%     Wt Readings from Last 3 Encounters:  10/25/17 176 lb (79.8 kg)  08/03/17 173 lb (78.5 kg)    BP 120/80   Pulse 83   Temp 98.3 F (36.8 C)   Wt 176 lb (79.8 kg)   SpO2 99%   BMI 28.41 kg/m   General Appearance:  Alert, cooperative, no distress, appears stated age  Head:  Normocephalic, without obvious abnormality, atraumatic  Eyes:  PERRL, conjunctiva/corneas clear, EOM's intact, fundi benign, both eyes  Ears:  Normal TM's and external ear canals, both ears  Nose: Nares normal, septum midline,mucosa normal, no drainage or sinus tenderness  Throat: Lips, mucosa, and tongue normal; teeth and gums normal  Neck: Supple, symmetrical, trachea midline, no adenopathy;  thyroid: not enlarged, symmetric, no tenderness/mass/nodules; no carotid bruit or JVD  Back:   Symmetric, no curvature, ROM normal, no CVA tenderness  Lungs:   Clear to auscultation bilaterally, respirations unlabored  Heart:  Regular rate and rhythm  Abdomen:   Soft, non-tender, bowel sounds active all four quadrants,  no masses, no organomegaly  Extremities: Extremities normal, atraumatic, no cyanosis or edema  Pulses: 2+ and symmetric  Skin: Left inguinal area reveals 1.5cm ovular area of raised erythema. Palpable fluctuance. Significant tenderness with palpation. No streaking tenderness.   Lymph nodes: Cervical, supraclavicular, and axillary nodes normal  Neurologic: Normal   Procedure: I&D  Verbal consent obtained from patient. Patient placed in  supine position on examination bed. Alcohol prep pad used to clean abscess. 1cc of Lidocaine injected for anesthesia. #11 blade scalpel was used for incision. Scant purulent drainage. Wound cleaned with saline. Bandaged with gauze and paper tape. Minimal bleeding. Patient  tolerated procedure well.  Assessment & Plan:  Left inguinal abscess  1. Abscess of groin, left  Patient advised to continue to clean wound. May take Tylenol or ibuprofen for pain/discomfort. Prescribed Doxycyline.  Discussed with the patient and all questioned fully answered. She will call me if any problems arise.  If symptom worsen or do not improve, return for follow-up with PCP, or at the emergency department if severity of symptoms warrant a higher level of care.   Rulon SeraSamantha F. Darsha Zumstein, MHS, PA-C Advanced Practice Provider [facility]  [facility]  [address], [facility], 1st Floor [place], [place] [address] (p):  [phone] [email or web address]

## 2017-10-25 NOTE — Patient Instructions (Signed)
Thanks for visiting [facility].  Take antibiotic, Doxycycline, as prescribed. 1 tab twice a day x 7 days. May take over the counter Tylenol or ibuprofen for pain/discomfort.  Clean wound with normal soap and water. May apply gauze or bandage.  Follow[facility] with family physician or [facility] in a few days if symptoms have not resolved.   Skin Abscess A skin abscess is an infected area on or under your skin that contains pus and other material. An abscess can happen almost anywhere on your body. Some abscesses break open (rupture) on their own. Most continue to get worse unless they are treated. The infection can spread deeper into the body and into your blood, which can make you feel sick. Treatment usually involves draining the abscess. Follow these instructions at home: Abscess Care  If you have an abscess that has not drained, place a warm, clean, wet washcloth over the abscess several times a day. Do this as told by your doctor.  Follow instructions from your doctor about how to take care of your abscess. Make sure you: ? Cover the abscess with a bandage (dressing). ? Change your bandage or gauze as told by your doctor. ? Wash your hands with soap and water before you change the bandage or gauze. If you cannot use soap and water, use hand sanitizer.  Check your abscess every day for signs that the infection is getting worse. Check for: ? More redness, swelling, or pain. ? More fluid or blood. ? Warmth. ? More pus or a bad smell. Medicines   Take over-the-counter and prescription medicines only as told by your doctor.  If you were prescribed an antibiotic medicine, take it as told by your doctor. Do not stop taking the antibiotic even if you start to feel better. General instructions  To avoid spreading the infection: ? Do not share personal care items, towels, or hot tubs with others. ? Avoid making skin-to-skin contact with other people.  Keep all follow-up visits as told by  your doctor. This is important. Contact a doctor if:  You have more redness, swelling, or pain around your abscess.  You have more fluid or blood coming from your abscess.  Your abscess feels warm when you touch it.  You have more pus or a bad smell coming from your abscess.  You have a fever.  Your muscles ache.  You have chills.  You feel sick. Get help right away if:  You have very bad (severe) pain.  You see red streaks on your skin spreading away from the abscess. This information is not intended to replace advice given to you by your health care provider. Make sure you discuss any questions you have with your health care provider. Document Released: 07/13/2007 Document Revised: 09/20/2015 Document Reviewed: 12/03/2014 Elsevier Interactive Patient Education  [employer]2018 Elsevier Inc.

## 2017-10-26 ENCOUNTER — Telehealth: Payer: Self-pay | Admitting: Emergency Medicine

## 2017-10-26 NOTE — Telephone Encounter (Signed)
Left message following up on visit with [facility] 

## 2017-10-27 ENCOUNTER — Telehealth: Payer: Self-pay | Admitting: Emergency Medicine

## 2017-10-27 NOTE — Telephone Encounter (Signed)
Provider reviewed message 

## 2017-11-14 ENCOUNTER — Ambulatory Visit (INDEPENDENT_AMBULATORY_CARE_PROVIDER_SITE_OTHER): Payer: Self-pay | Admitting: Physician Assistant

## 2017-11-14 VITALS — BP 122/82 | HR 90 | Temp 97.8°F | Ht 66.0 in | Wt 173.0 lb

## 2017-11-14 DIAGNOSIS — K047 Periapical abscess without sinus: Secondary | ICD-10-CM

## 2017-11-14 DIAGNOSIS — J00 Acute nasopharyngitis [common cold]: Secondary | ICD-10-CM

## 2017-11-14 MED ORDER — AMOXICILLIN 875 MG PO TABS: 875.0000 mg | ORAL_TABLET | Freq: Two times a day (BID) | ORAL | 0 refills | Status: AC

## 2017-11-14 MED ORDER — OXYMETAZOLINE HCL 0.05 % NA SOLN: 1.0000 | Freq: Two times a day (BID) | NASAL | 0 refills | Status: AC

## 2017-11-14 MED ORDER — GUAIFENESIN-DM 100-10 MG/5ML PO SYRP: 5.0000 mL | ORAL_SOLUTION | Freq: Four times a day (QID) | ORAL | 0 refills | Status: AC | PRN

## 2017-11-14 NOTE — Patient Instructions (Signed)
Thank you for choosing [facility] for your health care needs today.  You have been diagnosed with a viral upper respiratory infection (a cold).  You have been prescribed Afrin nasal spray; one spray in each nostril, twice a day for 3 days. Will help with nasal congestion. Will also help with nasal bleeding. May continue to use over the counter saline nasal spray.  You have been prescribed Robitussin DM syrup. Will help with nasal congestion and with cough. Use sparingly, due to breast feeding.  Increase fluids. Rest. May take over the counter Tylenol for pain/discomfort.  Return to [facility] in a few days if not feeling any better.  You are also being treated for a dental infection.  You have been prescribed Amoxicillin 875mg . Take one tablet by mouth twice a day x 10 days. This is safe while breast feeding.  Recommend you follow-up with dentist or oral surgeon for further treatment of recurrent dental abscess.  Upper Respiratory Infection, Adult  Most upper respiratory infections (URIs) are caused by a virus. A URI affects the nose, throat, and upper air passages. The most common type of URI is often called "the common cold." Follow these instructions at home:  Take medicines only as told by your doctor.  Gargle warm saltwater or take cough drops to comfort your throat as told by your doctor.  Use a warm mist humidifier or inhale steam from a shower to increase air moisture. This may make it easier to breathe.  Drink enough fluid to keep your pee (urine) clear or pale yellow.  Eat soups and other clear broths.  Have a healthy diet.  Rest as needed.  Go back to work when your fever is gone or your doctor says it is okay. ? You may need to stay home longer to avoid giving your URI to others. ? You can also wear a face mask and wash your hands often to prevent spread of the virus.  Use your inhaler more if you have asthma.  Do not use any tobacco products, including  cigarettes, chewing tobacco, or electronic cigarettes. If you need help quitting, ask your doctor. Contact a doctor if:  You are getting worse, not better.  Your symptoms are not helped by medicine.  You have chills.  You are getting more short of breath.  You have brown or red mucus.  You have yellow or brown discharge from your nose.  You have pain in your face, especially when you bend forward.  You have a fever.  You have puffy (swollen) neck glands.  You have pain while swallowing.  You have white areas in the back of your throat. Get help right away if:  You have very bad or constant: ? Headache. ? Ear pain. ? Pain in your forehead, behind your eyes, and over your cheekbones (sinus pain). ? Chest pain.  You have long-lasting (chronic) lung disease and any of the following: ? Wheezing. ? Long-lasting cough. ? Coughing up blood. ? A change in your usual mucus.  You have a stiff neck.  You have changes in your: ? Vision. ? Hearing. ? Thinking. ? Mood. This information is not intended to replace advice given to you by your health care provider. Make sure you discuss any questions you have with your health care provider. Document Released: 07/13/2007 Document Revised: 09/27/2015 Document Reviewed: 05/01/2013 Elsevier Interactive Patient Education  2018 [employer].

## 2017-11-14 NOTE — Progress Notes (Signed)
Patient ID: Jasmin Cervantes DOB: 1990/04/16 AGE: 27 y.o. MRN: [id number]   PCP: Patient, No Pcp Per   Chief Complaint:  Chief Complaint  Patient presents with  . Cough    congestion, headache x 2 days  . Oral Swelling    bottom teeth swelling x 2 day     Subjective:    HPI:  Jasmin Cervantes is a 27 y.o. female presents for evaluation  Chief Complaint  Patient presents with  . Cough    congestion, headache x 2 days  . Oral Swelling    bottom teeth swelling x 81 day    27 year old female presents to [place] with four day history of URI symptoms. Began with rhinorrhea and nasal congestion. Few episodes of epistaxis (blood streaked mucous when blowing nose). Has been using saline nasal rinse. Reports associated headache (pressure behind eyes), postnasal drip, sore throat, and cough (coarse/productive). Symptoms persisting. Has taken OTC Mucinex with minimal improvement.  Past two days has developed left lower posterior wisdom tooth pain. Deep ache. Pain with mastication; chewing on right side of mouth. Associated left sided facial swelling. Symptoms similar to previous dental abscess; approximately one year ago. Resolved with antibiotics. Patient went to a dentist. Was told she would have to have the wisdom tooth removed. Was advised to have surgical procedure involving MAC anesthesia. Patient states she is having issues with scheduling procedure due to insurance coverage/out of pocket cost.  Patient denies fever, chills, body aches, dizziness/lightheadedness, ear pain, maxillary sinus pain, chest pain, SOB, wheezing, nausea/vomiting, abdominal pain.  Patient currently breastfeeding her 78 month old child.  Patient just under one month ago was seen at [facility] for inguinal abscess. Underwent I&D. Was prescribed 7-day course of Doxycycline. Abscess has since resolved.  A complete, at least 10 system review of symptoms was performed, pertinent positives and  negatives as mentioned in HPI, otherwise negative.  The following portions of the patient's history were reviewed and updated as appropriate: allergies, current medications and past medical history.  There are no active problems to display for this patient.   No Known Allergies  Current Outpatient Medications on File Prior to Visit  Medication Sig Dispense Refill  . medroxyPROGESTERone (DEPO-PROVERA) 150 MG/ML injection Inject 150 mg into the muscle every 3 (three) months.    . Prenatal Vit-Fe Fumarate-FA (PRENATAL MULTIVITAMIN) TABS tablet Take 1 tablet by mouth daily at 12 noon.    Marland Kitchen aspirin-acetaminophen-caffeine (EXCEDRIN MIGRAINE) 250-250-65 MG per tablet Take 2 tablets by mouth every 6 (six) hours as needed for pain.    Marland Kitchen GENERESS FE 0.8-25 MG-MCG tablet Chew 1 tablet by mouth daily.    . metoCLOPramide (REGLAN) 10 MG tablet Take 0.5 tablets (5 mg total) by mouth every 6 (six) hours as needed (nausea/headache). (Patient not taking: Reported on 08/03/2017) 6 tablet 0  . terbinafine (LAMISIL AT) 1 % cream Apply 1 application topically 2 (two) times daily. (Patient not taking: Reported on 08/03/2017) 30 g 0  . triamcinolone cream (KENALOG) 0.1 % Apply 1 application topically 2 (two) times daily. Until skin looks normal (Patient not taking: Reported on 08/03/2017) 30 g 1   No current facility-administered medications on file prior to visit.        Objective:    Vitals:   11/14/17 1400  BP: 122/82  Pulse: 90  Temp: 97.8 F (36.6 C)  SpO2: 98%     Wt Readings from Last 3 Encounters:  11/14/17 173 lb (78.5 kg)  10/25/17  176 lb (79.8 kg)  08/03/17 173 lb (78.5 kg)    Physical Exam:   General Appearance:  Alert, cooperative, appears stated age. Afebrile. In no acute distress.  Head:  Normocephalic, without obvious abnormality, atraumatic. No visible facial swelling to provider.  Eyes:  PERRL, conjunctiva/corneas clear, EOM's intact, fundi benign, both eyes  Ears:  Normal TM's  and external ear canals, both ears  Nose: Bilateral naris with edema. Scant clear rhinorrhea. Dried blood visualized in left naris. Nasally sounding voice. Frequent inhaling mucous by patient.  Throat: Lips, mucosa, and tongue normal. Patient localizes pain to left lower most posterior tooth (wisdom tooth). Gum without edema or erythema. No bleeding or purulent drainage. Mild tenderness with palpation with applying pressure to superior aspect of tooth. No dental caries visible. No crack or fracture in tooth.  Neck: Supple, symmetrical, trachea midline, no adenopathy;  thyroid: not enlarged, symmetric, no tenderness/mass/nodules; no carotid bruit or JVD  Back:   Symmetric, no curvature, ROM normal, no CVA tenderness  Lungs:   Clear to auscultation bilaterally, respirations unlabored  Heart:  Regular rate and rhythm, S1 and S2 normal, no murmur, rub, or gallop  Abdomen:   Soft, non-tender, bowel sounds active all four quadrants,  no masses, no organomegaly  Extremities: Extremities normal, atraumatic, no cyanosis or edema  Pulses: 2+ and symmetric  Skin: Skin color, texture, turgor normal, no rashes or lesions  Lymph nodes: Cervical, supraclavicular, and axillary nodes normal  Neurologic: Normal    Assessment & Plan:   1. Acute nasopharyngitis  - guaiFENesin-dextromethorphan (ROBITUSSIN DM) 100-10 MG/5ML syrup; Take 5 mLs by mouth every 6 (six) hours as needed for up to 5 days for cough.  Dispense: 118 mL; Refill: 0 - oxymetazoline (AFRIN NASAL SPRAY) 0.05 % nasal spray; Place 1 spray into both nostrils 2 (two) times daily for 3 days.  Dispense: 30 mL; Refill: 0  2. Dental abscess  - amoxicillin (AMOXIL) 875 MG tablet; Take 1 tablet (875 mg total) by mouth 2 (two) times daily for 10 days.  Dispense: 20 tablet; Refill: 0  Patient with several day history of viral URI symptoms. Will treat with Afrin nasal spray (will also help with epistaxis) and Robitussin DM.  Patient with recurrent dental  abscess. Will treat with Amoxicillin. Advised follow-up with dentist or oral surgeon for definitive treatment; may need to discuss payment plan options.  Exam findings, diagnosis etiology and medication use and indications reviewed with patient. Follow-Up and discharge instructions provided. No emergent/urgent issues found on exam.  Patient education was provided.   Patient verbalized understanding of information provided and agrees with plan of care (POC), all questions answered. The patient is advised to call or return to clinic if condition does not see an improvement in symptoms, or to seek the care of the closest emergency department if condition worsens with the above plan.    Montey Hora, MHS, PA-C Advanced Practice Provider [facility]  [address], [facility], [place], [place] [address] (p):  [phone] [email or web address]

## 2018-01-01 DIAGNOSIS — Z3042 Encounter for surveillance of injectable contraceptive: Secondary | ICD-10-CM | POA: Diagnosis not present

## 2018-03-30 DIAGNOSIS — Z3042 Encounter for surveillance of injectable contraceptive: Secondary | ICD-10-CM | POA: Diagnosis not present

## 2018-06-18 DIAGNOSIS — Z3042 Encounter for surveillance of injectable contraceptive: Secondary | ICD-10-CM | POA: Diagnosis not present

## 2018-07-31 ENCOUNTER — Ambulatory Visit (INDEPENDENT_AMBULATORY_CARE_PROVIDER_SITE_OTHER): Payer: Self-pay | Admitting: Physician Assistant

## 2018-07-31 ENCOUNTER — Other Ambulatory Visit: Payer: Self-pay

## 2018-07-31 VITALS — BP 115/75 | HR 88 | Temp 98.4°F | Resp 16 | Ht 65.0 in | Wt 181.6 lb

## 2018-07-31 DIAGNOSIS — Z0000 Encounter for general adult medical examination without abnormal findings: Secondary | ICD-10-CM

## 2018-07-31 NOTE — Patient Instructions (Signed)

## 2018-07-31 NOTE — Progress Notes (Signed)
Jasmin Cervantes  MRN: [id number] DOB: 15-Nov-1990  Subjective:  Pt is a 28 y.o. female who presents for physical exam for [facility]. Does not have a PCP but typically followed by Gyn for yearly check ups. Has pap scheduled for July. Denies any chronic medical conditions requiring daily medication.   No structured exercise. Eats various foods-not many fruits. Avoids dairy. Drinks water. No multivitamins. She is engaged-sexually active with monogamous partner-no concern for STD. Regular menstrual cycles. Depo shots for contraception. Has one child. Drinks alcohol occasionally. Denies tobacco and illicit drug use. PSH of cataract surgery as a child-has residual effect of not seeing well out of right eye. Has an eye doctor. Has not been to the dentist in a while-needing to schedule wisdom teeth removal. Brushes daily. UTD on vaccinations.   Review of Systems  Constitutional: Negative for chills, fever and malaise/fatigue.  HENT: Negative for congestion, sinus pain and sore throat.   Respiratory: Negative for shortness of breath.   Cardiovascular: Negative for chest pain and palpitations.  Gastrointestinal: Negative for abdominal pain, nausea and vomiting.  Skin: Negative for rash.  Neurological: Negative for dizziness and headaches.  Psychiatric/Behavioral: Negative for depression. The patient is not nervous/anxious.      There are no active problems to display for this patient.   Current Outpatient Medications on File Prior to Visit  Medication Sig Dispense Refill  . aspirin-acetaminophen-caffeine (EXCEDRIN MIGRAINE) 250-250-65 MG per tablet Take 2 tablets by mouth every 6 (six) hours as needed for pain.    Marland Kitchen ELDERBERRY PO Take by mouth.    Blythe Stanford FE 0.8-25 MG-MCG tablet Chew 1 tablet by mouth daily.    . medroxyPROGESTERone (DEPO-PROVERA) 150 MG/ML injection Inject 150 mg into the muscle every 3 (three) months.    . metoCLOPramide (REGLAN) 10 MG tablet Take 0.5 tablets (5 mg  total) by mouth every 6 (six) hours as needed (nausea/headache). (Patient not taking: Reported on 08/03/2017) 6 tablet 0  . Prenatal Vit-Fe Fumarate-FA (PRENATAL MULTIVITAMIN) TABS tablet Take 1 tablet by mouth daily at 12 noon.    . terbinafine (LAMISIL AT) 1 % cream Apply 1 application topically 2 (two) times daily. (Patient not taking: Reported on 08/03/2017) 30 g 0  . triamcinolone cream (KENALOG) 0.1 % Apply 1 application topically 2 (two) times daily. Until skin looks normal (Patient not taking: Reported on 08/03/2017) 30 g 1   No current facility-administered medications on file prior to visit.     No Known Allergies    Past Surgical History:  Procedure Laterality Date  . CATARACT PEDIATRIC      No family history on file.  Review of Systems  Constitutional: Negative for chills, fever and malaise/fatigue.  HENT: Negative for congestion, sinus pain and sore throat.   Respiratory: Negative for shortness of breath.   Cardiovascular: Negative for chest pain and palpitations.  Gastrointestinal: Negative for abdominal pain, nausea and vomiting.  Skin: Negative for rash.  Neurological: Negative for dizziness and headaches.  Psychiatric/Behavioral: Negative for depression. The patient is not nervous/anxious.     Objective:  BP 115/75 (BP Location: Right Arm, Patient Position: Sitting, Cuff Size: Normal)   Pulse 88   Temp 98.4 F (36.9 C) (Oral)   Resp 16   Ht 5\' 5"  (1.651 m)   Wt 181 lb 9.6 oz (82.4 kg)   SpO2 99%   BMI 30.22 kg/m   Physical Exam Constitutional:      General: She is not in acute distress.  Appearance: Normal appearance. She is not ill-appearing or toxic-appearing.  HENT:     Head: Normocephalic and atraumatic.     Right Ear: Hearing, tympanic membrane, ear canal and external ear normal.     Left Ear: Hearing, tympanic membrane, ear canal and external ear normal.     Nose: Nose normal.     Mouth/Throat:     Lips: Pink.     Mouth: Mucous membranes are  moist.     Pharynx: Oropharynx is clear. Uvula midline. No oropharyngeal exudate.  Eyes:     General: Lids are normal. No scleral icterus.    Extraocular Movements: Extraocular movements intact.     Conjunctiva/sclera: Conjunctivae normal.     Pupils: Pupils are equal, round, and reactive to light.  Neck:     Musculoskeletal: Full passive range of motion without pain and normal range of motion.     Trachea: Trachea normal.  Cardiovascular:     Rate and Rhythm: Normal rate and regular rhythm.     Heart sounds: Normal heart sounds.  Pulmonary:     Effort: Pulmonary effort is normal.     Breath sounds: Normal breath sounds.  Abdominal:     General: Abdomen is flat. Bowel sounds are normal.     Palpations: Abdomen is soft.     Tenderness: There is no abdominal tenderness.  Skin:    General: Skin is warm and dry.  Neurological:     Mental Status: She is alert and oriented to person, place, and time.     Deep Tendon Reflexes: Reflexes are normal and symmetric.         Hearing Screening   125Hz  250Hz  500Hz  1000Hz  2000Hz  3000Hz  4000Hz  6000Hz  8000Hz   Right ear:   Pass Pass Pass  Pass    Left ear:   Pass Pass Pass  Pass      Visual Acuity Screening   Right eye Left eye Both eyes  Without correction: CF 10 20/20 20/20  With correction:          Assessment and Plan :  1. Physical exam Healthy 28 yo. No acute findings on PE. Rec establishing care with PCP for health maintenance. Pt agrees to do so.   Benjiman CoreBrittany , Cordelia Poche-C  [facility] 07/31/2018 4:41 PM

## 2018-09-05 DIAGNOSIS — R3915 Urgency of urination: Secondary | ICD-10-CM | POA: Diagnosis not present

## 2018-09-05 DIAGNOSIS — Z309 Encounter for contraceptive management, unspecified: Secondary | ICD-10-CM | POA: Diagnosis not present

## 2018-09-05 DIAGNOSIS — Z0000 Encounter for general adult medical examination without abnormal findings: Secondary | ICD-10-CM | POA: Diagnosis not present

## 2018-09-05 DIAGNOSIS — N3941 Urge incontinence: Secondary | ICD-10-CM | POA: Diagnosis not present

## 2018-09-26 DIAGNOSIS — Z309 Encounter for contraceptive management, unspecified: Secondary | ICD-10-CM | POA: Diagnosis not present

## 2018-09-26 DIAGNOSIS — Z01812 Encounter for preprocedural laboratory examination: Secondary | ICD-10-CM | POA: Diagnosis not present

## 2018-09-26 DIAGNOSIS — Z30017 Encounter for initial prescription of implantable subdermal contraceptive: Secondary | ICD-10-CM | POA: Diagnosis not present

## 2018-12-25 DIAGNOSIS — Z975 Presence of (intrauterine) contraceptive device: Secondary | ICD-10-CM | POA: Diagnosis not present

## 2019-06-03 DIAGNOSIS — L309 Dermatitis, unspecified: Secondary | ICD-10-CM | POA: Diagnosis not present

## 2019-06-04 ENCOUNTER — Ambulatory Visit
Admission: EM | Admit: 2019-06-04 | Discharge: 2019-06-04 | Disposition: A | Discharge status: Home / Self Care | Payer: 59 | Attending: Emergency Medicine | Admitting: Emergency Medicine

## 2019-06-04 ENCOUNTER — Other Ambulatory Visit: Payer: Self-pay

## 2019-06-04 DIAGNOSIS — N898 Other specified noninflammatory disorders of vagina: Secondary | ICD-10-CM | POA: Insufficient documentation

## 2019-06-04 DIAGNOSIS — R300 Dysuria: Secondary | ICD-10-CM | POA: Diagnosis not present

## 2019-06-04 LAB — POCT URINALYSIS DIP (MANUAL ENTRY)
Bilirubin, UA: NEGATIVE
Bilirubin, UA: NEGATIVE
Glucose, UA: NEGATIVE mg/dL
Glucose, UA: NEGATIVE mg/dL
Ketones, POC UA: NEGATIVE mg/dL
Ketones, POC UA: NEGATIVE mg/dL
Leukocytes, UA: NEGATIVE
Leukocytes, UA: NEGATIVE
Nitrite, UA: NEGATIVE
Nitrite, UA: NEGATIVE
Protein Ur, POC: NEGATIVE mg/dL
Protein Ur, POC: NEGATIVE mg/dL
Spec Grav, UA: 1.02 (ref 1.010–1.025)
Spec Grav, UA: 1.02 (ref 1.010–1.025)
Urobilinogen, UA: 0.2 E.U./dL
Urobilinogen, UA: 0.2 E.U./dL
pH, UA: 6 (ref 5.0–8.0)
pH, UA: 6 (ref 5.0–8.0)

## 2019-06-04 MED ORDER — FLUCONAZOLE 150 MG PO TABS: 150.0000 mg | ORAL_TABLET | Freq: Every day | ORAL | 0 refills | Status: DC

## 2019-06-04 NOTE — ED Provider Notes (Signed)
Renaldo Fiddler    CSN: [id number] Arrival date & time: 06/04/19  1637      History   Chief Complaint Chief Complaint  Patient presents with  . Vaginitis  . Dysuria    HPI Jasmin Cervantes is a 29 y.o. female.   Patient presents with dysuria, vaginal itching, and brown vaginal discharge x 1 week. She is sexually active but reports low suspicion of STDs. She has the Nexplanon implant. She denies rash, lesions, fever, chills, abdominal pain, hematuria, back pain, pelvic pain, or other symptoms. No treatments attempted at home.    The history is provided by the patient.    History reviewed. No pertinent past medical history.  There are no problems to display for this patient.   Past Surgical History:  Procedure Laterality Date  . CATARACT PEDIATRIC      OB History   No obstetric history on file.      Home Medications    Prior to Admission medications   Medication Sig Start Date End Date Taking? Authorizing Provider  aspirin-acetaminophen-caffeine (EXCEDRIN MIGRAINE) [phone] MG per tablet Take 2 tablets by mouth every 6 (six) hours as needed for pain.    [provider]  ELDERBERRY PO Take by mouth.    [provider]  fluconazole (DIFLUCAN) 150 MG tablet Take 1 tablet (150 mg total) by mouth daily. Take one tablet today.  May repeat in 3 days. 06/04/19   Mickie Bail, NP  GENERESS FE 0.8-25 MG-MCG tablet Chew 1 tablet by mouth daily. 10/11/12   [provider]  medroxyPROGESTERone (DEPO-PROVERA) 150 MG/ML injection Inject 150 mg into the muscle every 3 (three) months.    [provider]  metoCLOPramide (REGLAN) 10 MG tablet Take 0.5 tablets (5 mg total) by mouth every 6 (six) hours as needed (nausea/headache). Patient not taking: Reported on 08/03/2017 10/25/12   Blane Ohara, MD  Prenatal Vit-Fe Fumarate-FA (PRENATAL MULTIVITAMIN) TABS tablet Take 1 tablet by mouth daily at 12 noon.    [provider]    terbinafine (LAMISIL AT) 1 % cream Apply 1 application topically 2 (two) times daily. Patient not taking: Reported on 08/03/2017 03/31/13   Rodolph Bong, MD  triamcinolone cream (KENALOG) 0.1 % Apply 1 application topically 2 (two) times daily. Until skin looks normal Patient not taking: Reported on 08/03/2017 03/31/13   Rodolph Bong, MD    Family History Family History  Problem Relation Age of Onset  . Healthy Mother   . Healthy Father     Social History Social History   Tobacco Use  . Smoking status: Never Smoker  . Smokeless tobacco: Never Used  Substance Use Topics  . Alcohol use: No  . Drug use: No     Allergies   Patient has no known allergies.   Review of Systems Review of Systems  Constitutional: Negative for chills and fever.  HENT: Negative for ear pain and sore throat.   Eyes: Negative for pain and visual disturbance.  Respiratory: Negative for cough and shortness of breath.   Cardiovascular: Negative for chest pain and palpitations.  Gastrointestinal: Negative for abdominal pain and vomiting.  Genitourinary: Positive for dysuria and vaginal discharge. Negative for flank pain, hematuria and pelvic pain.  Musculoskeletal: Negative for arthralgias and back pain.  Skin: Negative for color change and rash.  Neurological: Negative for seizures and syncope.  All other systems reviewed and are negative.    Physical Exam Triage Vital Signs ED Triage Vitals  Enc Vitals Group     BP      Pulse      Resp      Temp      Temp src      SpO2      Weight      Height      Head Circumference      Peak Flow      Pain Score      Pain Loc      Pain Edu?      Excl. in [place]?    No data found.  Updated Vital Signs BP 116/76 (BP Location: Left Arm)   Pulse 80   Temp 99.1 F (37.3 C) (Oral)   Resp 16   Ht 5\' 6"  (1.676 m)   Wt 180 lb (81.6 kg)   SpO2 99%   BMI 29.05 kg/m   Visual Acuity Right Eye Distance:   Left Eye Distance:   Bilateral Distance:     Right Eye Near:   Left Eye Near:    Bilateral Near:     Physical Exam Vitals and nursing note reviewed.  Constitutional:      General: She is not in acute distress.    Appearance: She is well-developed.  HENT:     Head: Normocephalic and atraumatic.     Mouth/Throat:     Mouth: Mucous membranes are moist.  Eyes:     Conjunctiva/sclera: Conjunctivae normal.  Cardiovascular:     Rate and Rhythm: Normal rate and regular rhythm.     Heart sounds: No murmur.  Pulmonary:     Effort: Pulmonary effort is normal. No respiratory distress.     Breath sounds: Normal breath sounds.  Abdominal:     Palpations: Abdomen is soft.     Tenderness: There is no abdominal tenderness. There is no right CVA tenderness, left CVA tenderness, guarding or rebound.  Musculoskeletal:     Cervical back: Neck supple.  Skin:    General: Skin is warm and dry.     Findings: No rash.  Neurological:     General: No focal deficit present.     Mental Status: She is alert and oriented to person, place, and time.  Psychiatric:        Mood and Affect: Mood normal.        Behavior: Behavior normal.      UC Treatments / Results  Labs (all labs ordered are listed, but only abnormal results are displayed) Labs Reviewed  POCT URINALYSIS DIP (MANUAL ENTRY) - Abnormal; Notable for the following components:      Result Value   Blood, UA trace-intact (*)    All other components within normal limits  POCT URINALYSIS DIP (MANUAL ENTRY) - Abnormal; Notable for the following components:   Blood, UA trace-intact (*)    All other components within normal limits  URINE CULTURE  CERVICOVAGINAL ANCILLARY ONLY    EKG   Radiology No results found.  Procedures Procedures (including critical care time)  Medications Ordered in UC Medications - No data to display  Initial Impression / Assessment and Plan / UC Course  I have reviewed the triage vital signs and the nursing notes.  Pertinent labs & imaging  results that were available during my care of the patient were reviewed by me and considered in my medical decision making (see chart for details).   Vaginal discharge, dysuria. Trace blood in urine, negative for nitrite or LE. Urine culture pending. Vaginal self swab obtained by  patient for testing. Treating with Diflucan only today. Discussed with patient and she opts to wait for additional treatment if indicated when test results are back. Instructed her to abstain from sexual activity until the test results are back. Discussed that she and her sexual partner may require treatment at that time. Patient agrees to plan of care.      Final Clinical Impressions(s) / UC Diagnoses   Final diagnoses:  Vaginal discharge  Dysuria     Discharge Instructions     Take the Diflucan as directed.    Your vaginal tests are pending.  If your test results are positive, we will call you.  You may need additional treatment at that time.  Do not have sex until your test results are back.         ED Prescriptions    Medication Sig Dispense Auth. Provider   fluconazole (DIFLUCAN) 150 MG tablet Take 1 tablet (150 mg total) by mouth daily. Take one tablet today.  May repeat in 3 days. 2 tablet Mickie Bail, NP     PDMP not reviewed this encounter.   Mickie Bail, NP 06/04/19 1729

## 2019-06-04 NOTE — ED Triage Notes (Signed)
Patient complains of burning with urination and vaginitis like symptoms x 1 week. States that they had subsided at first and have since came back.

## 2019-06-04 NOTE — Discharge Instructions (Addendum)
Take the Diflucan as directed.    Your vaginal tests are pending.  If your test results are positive, we will call you.  You may need additional treatment at that time.  Do not have sex until your test results are back.

## 2019-06-05 LAB — CERVICOVAGINAL ANCILLARY ONLY
Bacterial Vaginitis (gardnerella): NEGATIVE
Candida Glabrata: NEGATIVE
Candida Vaginitis: POSITIVE — AB
Chlamydia: NEGATIVE
Comment: NEGATIVE
Comment: NEGATIVE
Comment: NEGATIVE
Comment: NEGATIVE
Comment: NEGATIVE
Comment: NORMAL
Neisseria Gonorrhea: NEGATIVE
Trichomonas: NEGATIVE

## 2019-06-05 LAB — URINE CULTURE: Culture: NO GROWTH

## 2019-06-19 ENCOUNTER — Telehealth: Payer: 59 | Admitting: Nurse Practitioner

## 2019-06-19 DIAGNOSIS — B373 Candidiasis of vulva and vagina: Secondary | ICD-10-CM

## 2019-06-19 DIAGNOSIS — B3731 Acute candidiasis of vulva and vagina: Secondary | ICD-10-CM

## 2019-06-19 MED ORDER — FLUCONAZOLE 150 MG PO TABS: 150.0000 mg | ORAL_TABLET | Freq: Every day | ORAL | 0 refills | Status: DC

## 2019-06-19 NOTE — Progress Notes (Signed)

## 2019-07-24 DIAGNOSIS — N898 Other specified noninflammatory disorders of vagina: Secondary | ICD-10-CM | POA: Diagnosis not present

## 2019-07-24 DIAGNOSIS — B373 Candidiasis of vulva and vagina: Secondary | ICD-10-CM | POA: Diagnosis not present

## 2019-09-09 DIAGNOSIS — Z1329 Encounter for screening for other suspected endocrine disorder: Secondary | ICD-10-CM | POA: Diagnosis not present

## 2019-09-09 DIAGNOSIS — Z1389 Encounter for screening for other disorder: Secondary | ICD-10-CM | POA: Diagnosis not present

## 2019-09-09 DIAGNOSIS — Z13220 Encounter for screening for lipoid disorders: Secondary | ICD-10-CM | POA: Diagnosis not present

## 2019-09-09 DIAGNOSIS — E041 Nontoxic single thyroid nodule: Secondary | ICD-10-CM | POA: Diagnosis not present

## 2019-09-09 DIAGNOSIS — Z113 Encounter for screening for infections with a predominantly sexual mode of transmission: Secondary | ICD-10-CM | POA: Diagnosis not present

## 2019-09-09 DIAGNOSIS — Z0000 Encounter for general adult medical examination without abnormal findings: Secondary | ICD-10-CM | POA: Diagnosis not present

## 2019-09-09 DIAGNOSIS — Z79899 Other long term (current) drug therapy: Secondary | ICD-10-CM | POA: Diagnosis not present

## 2019-09-09 DIAGNOSIS — Z13228 Encounter for screening for other metabolic disorders: Secondary | ICD-10-CM | POA: Diagnosis not present

## 2019-09-09 DIAGNOSIS — Z130 Encounter for screening for diseases of the blood and blood-forming organs and certain disorders involving the immune mechanism: Secondary | ICD-10-CM | POA: Diagnosis not present

## 2020-01-27 DIAGNOSIS — Z3046 Encounter for surveillance of implantable subdermal contraceptive: Secondary | ICD-10-CM | POA: Diagnosis not present

## 2020-01-27 DIAGNOSIS — Z309 Encounter for contraceptive management, unspecified: Secondary | ICD-10-CM | POA: Diagnosis not present

## 2020-02-06 DIAGNOSIS — L2084 Intrinsic (allergic) eczema: Secondary | ICD-10-CM | POA: Diagnosis not present

## 2020-02-06 DIAGNOSIS — L80 Vitiligo: Secondary | ICD-10-CM | POA: Diagnosis not present

## 2020-02-06 DIAGNOSIS — L816 Other disorders of diminished melanin formation: Secondary | ICD-10-CM | POA: Diagnosis not present

## 2020-02-27 DIAGNOSIS — L80 Vitiligo: Secondary | ICD-10-CM | POA: Diagnosis not present

## 2020-02-27 DIAGNOSIS — L2084 Intrinsic (allergic) eczema: Secondary | ICD-10-CM | POA: Diagnosis not present

## 2020-04-22 DIAGNOSIS — L80 Vitiligo: Secondary | ICD-10-CM | POA: Diagnosis not present

## 2020-06-04 DIAGNOSIS — L80 Vitiligo: Secondary | ICD-10-CM | POA: Diagnosis not present

## 2020-06-04 DIAGNOSIS — L2084 Intrinsic (allergic) eczema: Secondary | ICD-10-CM | POA: Diagnosis not present

## 2020-09-14 DIAGNOSIS — Z0000 Encounter for general adult medical examination without abnormal findings: Secondary | ICD-10-CM | POA: Diagnosis not present

## 2020-09-14 DIAGNOSIS — Z1329 Encounter for screening for other suspected endocrine disorder: Secondary | ICD-10-CM | POA: Diagnosis not present

## 2020-09-14 DIAGNOSIS — L80 Vitiligo: Secondary | ICD-10-CM | POA: Diagnosis not present

## 2020-09-14 DIAGNOSIS — Z130 Encounter for screening for diseases of the blood and blood-forming organs and certain disorders involving the immune mechanism: Secondary | ICD-10-CM | POA: Diagnosis not present

## 2020-09-14 DIAGNOSIS — Z13220 Encounter for screening for lipoid disorders: Secondary | ICD-10-CM | POA: Diagnosis not present

## 2020-09-14 DIAGNOSIS — N898 Other specified noninflammatory disorders of vagina: Secondary | ICD-10-CM | POA: Diagnosis not present

## 2020-09-14 DIAGNOSIS — Z1389 Encounter for screening for other disorder: Secondary | ICD-10-CM | POA: Diagnosis not present

## 2020-09-14 DIAGNOSIS — L309 Dermatitis, unspecified: Secondary | ICD-10-CM | POA: Diagnosis not present

## 2020-09-14 DIAGNOSIS — Z13228 Encounter for screening for other metabolic disorders: Secondary | ICD-10-CM | POA: Diagnosis not present

## 2020-09-14 DIAGNOSIS — K649 Unspecified hemorrhoids: Secondary | ICD-10-CM | POA: Diagnosis not present

## 2020-12-29 ENCOUNTER — Ambulatory Visit
Admission: RE | Admit: 2020-12-29 | Discharge: 2020-12-29 | Disposition: A | Discharge status: Home / Self Care | Payer: 59 | Source: Ambulatory Visit | Attending: Family Medicine | Admitting: Family Medicine

## 2020-12-29 VITALS — BP 125/79 | HR 84 | Temp 98.2°F

## 2020-12-29 DIAGNOSIS — N898 Other specified noninflammatory disorders of vagina: Secondary | ICD-10-CM | POA: Insufficient documentation

## 2020-12-29 DIAGNOSIS — R300 Dysuria: Secondary | ICD-10-CM | POA: Insufficient documentation

## 2020-12-29 LAB — POCT URINALYSIS DIP (MANUAL ENTRY)
Bilirubin, UA: NEGATIVE
Blood, UA: NEGATIVE
Glucose, UA: NEGATIVE mg/dL
Leukocytes, UA: NEGATIVE
Nitrite, UA: NEGATIVE
Protein Ur, POC: NEGATIVE mg/dL
Spec Grav, UA: 1.02 (ref 1.010–1.025)
Urobilinogen, UA: 0.2 E.U./dL
pH, UA: 5.5 (ref 5.0–8.0)

## 2020-12-29 LAB — POCT URINE PREGNANCY: Preg Test, Ur: NEGATIVE

## 2020-12-29 MED ORDER — FLUCONAZOLE 150 MG PO TABS: 150.0000 mg | ORAL_TABLET | Freq: Every day | ORAL | 0 refills | Status: AC

## 2020-12-29 NOTE — Discharge Instructions (Signed)
Your vaginal cytology will result within the next few days.  You being treated today for yeast however if any of your results from your vaginal cytology require any additional treatment we will notify you by phone.

## 2020-12-29 NOTE — ED Triage Notes (Signed)
Pt c/o vaginal discharge and Vaginal itching x 1 week. She has had some pressure in her pelvic area for the past few days.

## 2020-12-29 NOTE — ED Provider Notes (Signed)
Renaldo Fiddler    CSN: [id number] Arrival date & time: 12/29/20  1659      History   Chief Complaint Chief Complaint  Patient presents with   Vaginal Discharge   Vaginal Itching    HPI Jasmin Cervantes is a 30 y.o. female.   HPI Patient presents with vaginal irritation and abnormal vaginal discharge. She reports symptoms have been present for a week.  Her menstrual cycle ended on 12/23/2020.  Also complains of dysuria and lower abdominal pressure. Denies any concern for exposure to STIs.  Denies any nausea, vomiting or flank pain.     History reviewed. No pertinent past medical history.  There are no problems to display for this patient.   Past Surgical History:  Procedure Laterality Date   CATARACT PEDIATRIC      OB History   No obstetric history on file.      Home Medications    Prior to Admission medications   Medication Sig Start Date End Date Taking? Authorizing Provider  fluconazole (DIFLUCAN) 150 MG tablet Take 1 tablet (150 mg total) by mouth daily. 12/29/20  Yes Bing Neighbors, FNP  aspirin-acetaminophen-caffeine (EXCEDRIN MIGRAINE) [phone] MG per tablet Take 2 tablets by mouth every 6 (six) hours as needed for pain.    [provider]  ELDERBERRY PO Take by mouth.    [provider]  GENERESS FE 0.8-25 MG-MCG tablet Chew 1 tablet by mouth daily. 10/11/12   [provider]  medroxyPROGESTERone (DEPO-PROVERA) 150 MG/ML injection Inject 150 mg into the muscle every 3 (three) months.    [provider]  metoCLOPramide (REGLAN) 10 MG tablet Take 0.5 tablets (5 mg total) by mouth every 6 (six) hours as needed (nausea/headache). Patient not taking: Reported on 08/03/2017 10/25/12   Blane Ohara, MD  Prenatal Vit-Fe Fumarate-FA (PRENATAL MULTIVITAMIN) TABS tablet Take 1 tablet by mouth daily at 12 noon.    [provider]  terbinafine (LAMISIL AT) 1 % cream Apply 1 application topically 2 (two)  times daily. Patient not taking: Reported on 08/03/2017 03/31/13   Rodolph Bong, MD  triamcinolone cream (KENALOG) 0.1 % Apply 1 application topically 2 (two) times daily. Until skin looks normal Patient not taking: Reported on 08/03/2017 03/31/13   Rodolph Bong, MD    Family History Family History  Problem Relation Age of Onset   Healthy Mother    Healthy Father     Social History Social History   Tobacco Use   Smoking status: Never   Smokeless tobacco: Never  Vaping Use   Vaping Use: Never used  Substance Use Topics   Alcohol use: No   Drug use: No     Allergies   Patient has no known allergies.   Review of Systems Review of Systems Pertinent negatives listed in HPI   Physical Exam Triage Vital Signs ED Triage Vitals  Enc Vitals Group     BP 12/29/20 1728 125/79     Pulse Rate 12/29/20 1728 84     Resp --      Temp 12/29/20 1728 98.2 F (36.8 C)     Temp Source 12/29/20 1728 Oral     SpO2 12/29/20 1728 98 %     Weight --      Height --      Head Circumference --      Peak Flow --      Pain Score 12/29/20 1727 0     Pain Loc --  Pain Edu? --      Excl. in [place]? --    No data found.  Updated Vital Signs BP 125/79 (BP Location: Left Arm)   Pulse 84   Temp 98.2 F (36.8 C) (Oral)   LMP 12/23/2020   SpO2 98%   Visual Acuity Right Eye Distance:   Left Eye Distance:   Bilateral Distance:    Right Eye Near:   Left Eye Near:    Bilateral Near:     Physical Exam General appearance: Alert, well developed, well nourished, cooperative and in no distress Head: Normocephalic, without obvious abnormality, atraumatic Respiratory: Respirations even and unlabored, normal respiratory rate Heart: rate and rhythm normal. No gallop or murmurs noted on exam  Extremities: No gross deformities Skin: Skin color, texture, turgor normal. No rashes seen  Psych: Appropriate mood and affect. Neurologic: No focal neurological abnormalities  Vaginal cytology  self-collected  UC Treatments / Results  Labs (all labs ordered are listed, but only abnormal results are displayed) Labs Reviewed  POCT URINALYSIS DIP (MANUAL ENTRY) - Abnormal; Notable for the following components:      Result Value   Ketones, POC UA small (15) (*)    All other components within normal limits  POCT URINE PREGNANCY  CERVICOVAGINAL ANCILLARY ONLY    EKG   Radiology No results found.  Procedures Procedures (including critical care time)  Medications Ordered in UC Medications - No data to display  Initial Impression / Assessment and Plan / UC Course  I have reviewed the triage vital signs and the nursing notes.  Pertinent labs & imaging results that were available during my care of the patient were reviewed by me and considered in my medical decision making (see chart for details).    Vaginal cytology pending.  Treating for Diflucan as patient has a history of prior yeast infections.  UA negative.  Urine pregnancy negative. If any additional treatment is warranted following receipt of cytology results we will notify patient via phone.  Patient verbalized understanding and agreement with plan Final Clinical Impressions(s) / UC Diagnoses   Final diagnoses:  Vaginal discharge     Discharge Instructions      Your vaginal cytology will result within the next few days.  You being treated today for yeast however if any of your results from your vaginal cytology require any additional treatment we will notify you by phone.     ED Prescriptions     Medication Sig Dispense Auth. Provider   fluconazole (DIFLUCAN) 150 MG tablet Take 1 tablet (150 mg total) by mouth daily. 2 tablet Bing Neighbors, FNP      PDMP not reviewed this encounter.   Bing Neighbors, FNP 12/29/20 1843

## 2020-12-30 LAB — CERVICOVAGINAL ANCILLARY ONLY
Bacterial Vaginitis (gardnerella): NEGATIVE
Candida Glabrata: NEGATIVE
Candida Vaginitis: NEGATIVE
Chlamydia: NEGATIVE
Comment: NEGATIVE
Comment: NEGATIVE
Comment: NEGATIVE
Comment: NEGATIVE
Comment: NEGATIVE
Comment: NORMAL
Neisseria Gonorrhea: NEGATIVE
Trichomonas: NEGATIVE

## 2021-05-11 DIAGNOSIS — L80 Vitiligo: Secondary | ICD-10-CM | POA: Diagnosis not present

## 2021-07-29 DIAGNOSIS — E041 Nontoxic single thyroid nodule: Secondary | ICD-10-CM | POA: Diagnosis not present

## 2021-07-29 DIAGNOSIS — N946 Dysmenorrhea, unspecified: Secondary | ICD-10-CM | POA: Diagnosis not present

## 2021-07-29 DIAGNOSIS — N939 Abnormal uterine and vaginal bleeding, unspecified: Secondary | ICD-10-CM | POA: Diagnosis not present

## 2021-09-17 DIAGNOSIS — L732 Hidradenitis suppurativa: Secondary | ICD-10-CM | POA: Diagnosis not present

## 2021-09-17 DIAGNOSIS — Z1389 Encounter for screening for other disorder: Secondary | ICD-10-CM | POA: Diagnosis not present

## 2021-09-17 DIAGNOSIS — Z130 Encounter for screening for diseases of the blood and blood-forming organs and certain disorders involving the immune mechanism: Secondary | ICD-10-CM | POA: Diagnosis not present

## 2021-09-17 DIAGNOSIS — Z01419 Encounter for gynecological examination (general) (routine) without abnormal findings: Secondary | ICD-10-CM | POA: Diagnosis not present

## 2021-09-17 DIAGNOSIS — Z0000 Encounter for general adult medical examination without abnormal findings: Secondary | ICD-10-CM | POA: Diagnosis not present

## 2021-09-17 DIAGNOSIS — E041 Nontoxic single thyroid nodule: Secondary | ICD-10-CM | POA: Diagnosis not present

## 2021-09-17 DIAGNOSIS — Z113 Encounter for screening for infections with a predominantly sexual mode of transmission: Secondary | ICD-10-CM | POA: Diagnosis not present

## 2021-09-17 DIAGNOSIS — Z13220 Encounter for screening for lipoid disorders: Secondary | ICD-10-CM | POA: Diagnosis not present

## 2023-12-15 ENCOUNTER — Ambulatory Visit: Admitting: Podiatry

## 2023-12-15 ENCOUNTER — Encounter: Payer: Self-pay | Admitting: Podiatry

## 2023-12-15 VITALS — Ht 66.0 in | Wt 180.0 lb

## 2023-12-15 DIAGNOSIS — M21961 Unspecified acquired deformity of right lower leg: Secondary | ICD-10-CM

## 2023-12-15 DIAGNOSIS — M778 Other enthesopathies, not elsewhere classified: Secondary | ICD-10-CM | POA: Diagnosis not present

## 2023-12-15 DIAGNOSIS — M21962 Unspecified acquired deformity of left lower leg: Secondary | ICD-10-CM | POA: Diagnosis not present

## 2023-12-15 NOTE — Progress Notes (Signed)
 Subjective:  Patient ID: Jasmin Cervantes, female    DOB: [birth year],  MRN: [id number]  Chief Complaint  Patient presents with   Foot Pain    Patient is here for dorsal right foot pain aching not all the time but can feel it while walking    33 y.o. female presents with the above complaint.  Patient presents for right dorsal foot pain that has been going for quite some time.  It is aching all the time can feel it with pressure and walking.  She wanted to get it evaluated.  She does not wear any orthotic she wears regular shoes pain scale is 5 out of 10 dull aching nature.  She does not recall any injury.   Review of Systems: Negative except as noted in the HPI. Denies N/V/F/Ch.  History reviewed. No pertinent past medical history.  Current Outpatient Medications:    aspirin-acetaminophen-caffeine (EXCEDRIN MIGRAINE) 250-250-65 MG per tablet, Take 2 tablets by mouth every 6 (six) hours as needed for pain., Disp: , Rfl:    ELDERBERRY PO, Take by mouth., Disp: , Rfl:    fluconazole  (DIFLUCAN ) 150 MG tablet, Take 1 tablet (150 mg total) by mouth daily., Disp: 2 tablet, Rfl: 0   GENERESS FE 0.8-25 MG-MCG tablet, Chew 1 tablet by mouth daily., Disp: , Rfl:    medroxyPROGESTERone  (DEPO-PROVERA ) 150 MG/ML injection, Inject 150 mg into the muscle every 3 (three) months., Disp: , Rfl:    metoCLOPramide  (REGLAN ) 10 MG tablet, Take 0.5 tablets (5 mg total) by mouth every 6 (six) hours as needed (nausea/headache)., Disp: 6 tablet, Rfl: 0   Prenatal Vit-Fe Fumarate-FA (PRENATAL MULTIVITAMIN) TABS tablet, Take 1 tablet by mouth daily at 12 noon., Disp: , Rfl:    terbinafine  (LAMISIL  AT) 1 % cream, Apply 1 application topically 2 (two) times daily., Disp: 30 g, Rfl: 0   triamcinolone  cream (KENALOG ) 0.1 %, Apply 1 application topically 2 (two) times daily. Until skin looks normal, Disp: 30 g, Rfl: 1  Social History   Tobacco Use  Smoking Status Never  Smokeless Tobacco Never    No Known  Allergies Objective:  There were no vitals filed for this visit. Body mass index is 29.05 kg/m. Constitutional Well developed. Well nourished.  Vascular Dorsalis pedis pulses palpable bilaterally. Posterior tibial pulses palpable bilaterally. Capillary refill normal to all digits.  No cyanosis or clubbing noted. Pedal hair growth normal.  Neurologic Normal speech. Oriented to person, place, and time. Epicritic sensation to light touch grossly present bilaterally.  Dermatologic Nails well groomed and normal in appearance. No open wounds. No skin lesions.  Orthopedic: Pain on the course of the extensor tendon pain with resisted dorsiflexion of the toe no pain with plantarflexion of the toe resisted.  No pain at the Lisfranc interval.  No clinically appreciable arthritic changes noted.   Radiographs: None Assessment:   1. Extensor tendinitis of foot   2. Deformity of both feet    Plan:  Patient was evaluated and treated and all questions answered.  Right foot extensor tendinitis - All questions and concerns were discussed with the patient in extensive detail given the amount of pain that she is experiencing she would benefit from cam boot immobilization she agrees with the plan cam boot was dispensed.  I will see her back again in 4 weeks and we will transition her to regular shoes with Tri-Lock ankle brace versus steroid injection if needed  Pes planovalgus/foot deformity -I explained to patient the etiology of pes  planovalgus and relationship with heel pain/arch pain and various treatment options were discussed.  Given patient foot structure in the setting of heel pain/arch pain I believe patient will benefit from custom-made orthotics to help control the hindfoot motion support the arch of the foot and take the stress away from arches.  Patient agrees with the plan like to proceed with orthotics -Patient was casted for orthotics   No follow-ups on file.

## 2024-01-08 ENCOUNTER — Telehealth: Payer: Self-pay

## 2024-01-08 NOTE — Telephone Encounter (Signed)
 Orthotics are in 01/08/2024

## 2024-01-12 ENCOUNTER — Ambulatory Visit: Admitting: Podiatry

## 2024-01-19 ENCOUNTER — Ambulatory Visit: Admitting: Podiatry

## 2024-01-19 DIAGNOSIS — M21962 Unspecified acquired deformity of left lower leg: Secondary | ICD-10-CM | POA: Diagnosis not present

## 2024-01-19 DIAGNOSIS — M7752 Other enthesopathy of left foot: Secondary | ICD-10-CM | POA: Diagnosis not present

## 2024-01-19 DIAGNOSIS — M21961 Unspecified acquired deformity of right lower leg: Secondary | ICD-10-CM | POA: Diagnosis not present

## 2024-01-19 NOTE — Progress Notes (Signed)
 Subjective:  Patient ID: Jasmin Cervantes, female    DOB: 1990-09-06,  MRN: [id number]  Chief Complaint  Patient presents with   Extensor tendinitis of foot    Extensor tendinitis of foot    33 y.o. female presents with the above complaint.  Patient presents for follow-up for right dorsal foot pain/extensor tendinitis.  She states that hurts with ambulation but doing much better.  Cam boot immobilization helped.   Review of Systems: Negative except as noted in the HPI. Denies N/V/F/Ch.  No past medical history on file.  Current Outpatient Medications:    aspirin-acetaminophen-caffeine (EXCEDRIN MIGRAINE) 250-250-65 MG per tablet, Take 2 tablets by mouth every 6 (six) hours as needed for pain., Disp: , Rfl:    ELDERBERRY PO, Take by mouth., Disp: , Rfl:    fluconazole  (DIFLUCAN ) 150 MG tablet, Take 1 tablet (150 mg total) by mouth daily., Disp: 2 tablet, Rfl: 0   GENERESS FE 0.8-25 MG-MCG tablet, Chew 1 tablet by mouth daily., Disp: , Rfl:    medroxyPROGESTERone  (DEPO-PROVERA ) 150 MG/ML injection, Inject 150 mg into the muscle every 3 (three) months., Disp: , Rfl:    metoCLOPramide  (REGLAN ) 10 MG tablet, Take 0.5 tablets (5 mg total) by mouth every 6 (six) hours as needed (nausea/headache)., Disp: 6 tablet, Rfl: 0   Prenatal Vit-Fe Fumarate-FA (PRENATAL MULTIVITAMIN) TABS tablet, Take 1 tablet by mouth daily at 12 noon., Disp: , Rfl:    terbinafine  (LAMISIL  AT) 1 % cream, Apply 1 application topically 2 (two) times daily., Disp: 30 g, Rfl: 0   triamcinolone  cream (KENALOG ) 0.1 %, Apply 1 application topically 2 (two) times daily. Until skin looks normal, Disp: 30 g, Rfl: 1  Social History   Tobacco Use  Smoking Status Never  Smokeless Tobacco Never    No Known Allergies Objective:  There were no vitals filed for this visit. There is no height or weight on file to calculate BMI. Constitutional Well developed. Well nourished.  Vascular Dorsalis pedis pulses palpable  bilaterally. Posterior tibial pulses palpable bilaterally. Capillary refill normal to all digits.  No cyanosis or clubbing noted. Pedal hair growth normal.  Neurologic Normal speech. Oriented to person, place, and time. Epicritic sensation to light touch grossly present bilaterally.  Dermatologic Nails well groomed and normal in appearance. No open wounds. No skin lesions.  Orthopedic: Pain on the course of the extensor tendon pain with resisted dorsiflexion of the toe no pain with plantarflexion of the toe resisted.  No pain at the Lisfranc interval.  No clinically appreciable arthritic changes noted.   Radiographs: None Assessment:   No diagnosis found.  Plan:  Patient was evaluated and treated and all questions answered.  Right foot extensor tendinitis - All questions and concerns were discussed with the patient in extensive detail clinically cam boot immobilization helped she still has some residual pain.  She would benefit from steroid injection of decreasing inflammatory, surgical pain.  Patient agrees with plan to proceed with steroid injection.  I discussed the risk of breast rupture associate with this she states understanding to proceed despite the risk -A steroid injection was performed at left dorsal midfoot using 1% plain Lidocaine and 10 mg of Kenalog . This was well tolerated.   Pes planovalgus/foot deformity -I explained to patient the etiology of pes planovalgus and relationship with heel pain/arch pain and various treatment options were discussed.  Given patient foot structure in the setting of heel pain/arch pain I believe patient will benefit from custom-made orthotics to help  control the hindfoot motion support the arch of the foot and take the stress away from arches.  Patient agrees with the plan like to proceed with orthotics - Orthotics were dispensed and functioning well no acute complaints.   No follow-ups on file.

## 2024-01-26 ENCOUNTER — Ambulatory Visit: Admitting: Podiatry

## 2024-01-31 ENCOUNTER — Ambulatory Visit: Admitting: Podiatry
